# Patient Record
Sex: Male | Born: 1975 | Race: White | Hispanic: No | Marital: Married | Smoking: Former smoker
Health system: Southern US, Community
[De-identification: ages and names within clinical notes are randomized; demographics above are authoritative.]

## PROBLEM LIST (undated history)

## (undated) DIAGNOSIS — Z87442 Personal history of urinary calculi: Secondary | ICD-10-CM

## (undated) DIAGNOSIS — E785 Hyperlipidemia, unspecified: Secondary | ICD-10-CM

## (undated) DIAGNOSIS — K7581 Nonalcoholic steatohepatitis (NASH): Secondary | ICD-10-CM

## (undated) DIAGNOSIS — I10 Essential (primary) hypertension: Secondary | ICD-10-CM

## (undated) HISTORY — DX: Essential (primary) hypertension: I10

## (undated) HISTORY — DX: Personal history of urinary calculi: Z87.442

## (undated) HISTORY — PX: WISDOM TOOTH EXTRACTION: SHX21

## (undated) HISTORY — DX: Nonalcoholic steatohepatitis (NASH): K75.81

## (undated) HISTORY — DX: Hyperlipidemia, unspecified: E78.5

## (undated) HISTORY — PX: APPENDECTOMY: SHX54

---
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
## 2011-06-29 ENCOUNTER — Ambulatory Visit: Payer: Self-pay | Admitting: Family Medicine

## 2012-04-03 ENCOUNTER — Emergency Department: Payer: Self-pay | Admitting: *Deleted

## 2012-04-03 LAB — CBC WITH DIFFERENTIAL/PLATELET
Basophil #: 0 10*3/uL (ref 0.0–0.1)
Basophil %: 0.4 %
Eosinophil #: 0.1 10*3/uL (ref 0.0–0.7)
Eosinophil %: 0.9 %
HCT: 39.9 % — ABNORMAL LOW (ref 40.0–52.0)
HGB: 13.9 g/dL (ref 13.0–18.0)
Lymphocyte #: 1.7 10*3/uL (ref 1.0–3.6)
Lymphocyte %: 16.8 %
MCH: 30.8 pg (ref 26.0–34.0)
MCHC: 34.9 g/dL (ref 32.0–36.0)
MCV: 88 fL (ref 80–100)
Monocyte #: 0.6 x10 3/mm (ref 0.2–1.0)
Monocyte %: 5.9 %
Neutrophil #: 7.8 10*3/uL — ABNORMAL HIGH (ref 1.4–6.5)
Neutrophil %: 76 %
Platelet: 200 10*3/uL (ref 150–440)
RBC: 4.52 10*6/uL (ref 4.40–5.90)
RDW: 13.5 % (ref 11.5–14.5)
WBC: 10.2 10*3/uL (ref 3.8–10.6)

## 2012-04-03 LAB — URINALYSIS, COMPLETE
Bacteria: NONE SEEN
Bilirubin,UR: NEGATIVE
Glucose,UR: NEGATIVE mg/dL (ref 0–75)
Ketone: NEGATIVE
Leukocyte Esterase: NEGATIVE
Nitrite: NEGATIVE
Ph: 5 (ref 4.5–8.0)
Protein: NEGATIVE
RBC,UR: 262 /HPF (ref 0–5)
Specific Gravity: 1.019 (ref 1.003–1.030)
Squamous Epithelial: NONE SEEN
WBC UR: NONE SEEN /HPF (ref 0–5)

## 2012-04-03 LAB — COMPREHENSIVE METABOLIC PANEL
Albumin: 4.6 g/dL (ref 3.4–5.0)
Alkaline Phosphatase: 79 U/L (ref 50–136)
Anion Gap: 9 (ref 7–16)
BUN: 19 mg/dL — ABNORMAL HIGH (ref 7–18)
Bilirubin,Total: 0.7 mg/dL (ref 0.2–1.0)
Calcium, Total: 9.2 mg/dL (ref 8.5–10.1)
Chloride: 102 mmol/L (ref 98–107)
Co2: 28 mmol/L (ref 21–32)
Creatinine: 0.97 mg/dL (ref 0.60–1.30)
EGFR (African American): >60
EGFR (Non-African Amer.): >60
Glucose: 122 mg/dL — ABNORMAL HIGH (ref 65–99)
Osmolality: 281 (ref 275–301)
Potassium: 4.3 mmol/L (ref 3.5–5.1)
SGOT(AST): 76 U/L — ABNORMAL HIGH (ref 15–37)
SGPT (ALT): 210 U/L — ABNORMAL HIGH
Sodium: 139 mmol/L (ref 136–145)
Total Protein: 8.4 g/dL — ABNORMAL HIGH (ref 6.4–8.2)

## 2013-09-11 IMAGING — CT CT STONE STUDY
1 of 2 series · 15 of 32 positions shown, 19 images · non-contrast
Comparison: none

REASON FOR EXAM: left flank pain
COMMENTS:

[Series 2: 3mm soft tissue · axial · 0.87mm/px · z∈[-554,-98]mm · 15 of 166 slices shown, 19 images]
[im 7/166  soft-tissue]
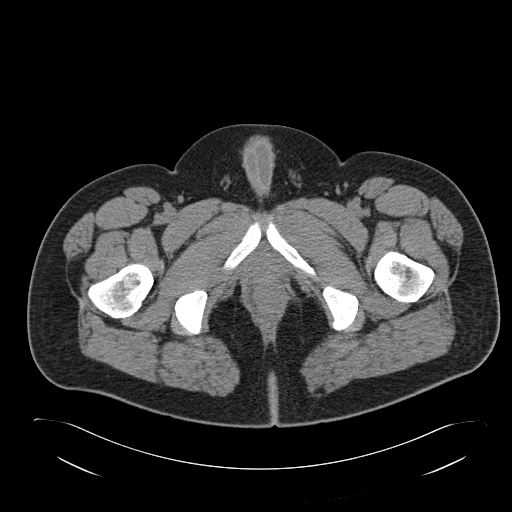
[im 7/166  bone]
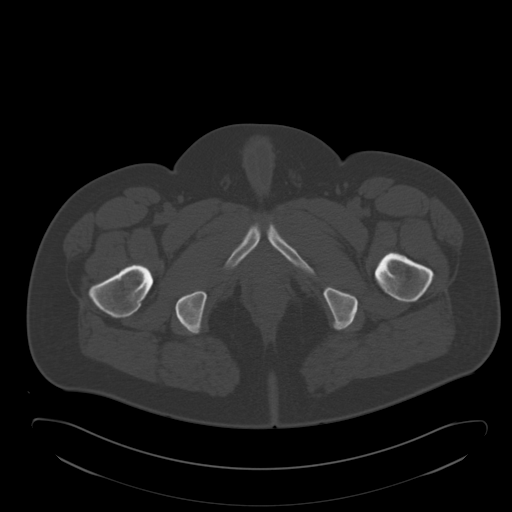
[im 20/166  soft-tissue]
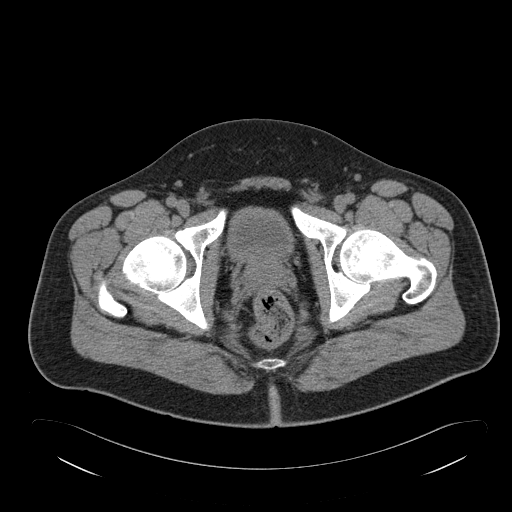
[im 34/166  soft-tissue]
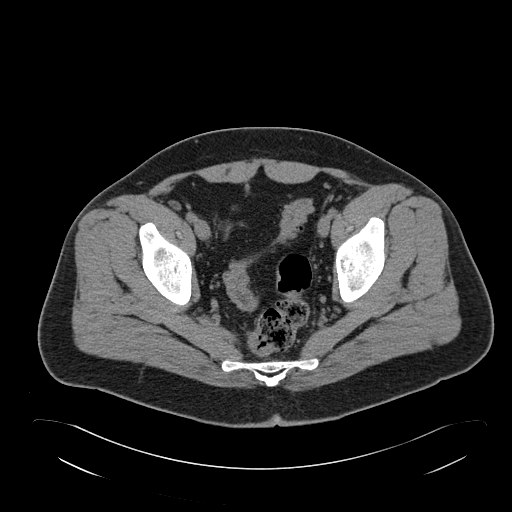
[im 47/166  soft-tissue]
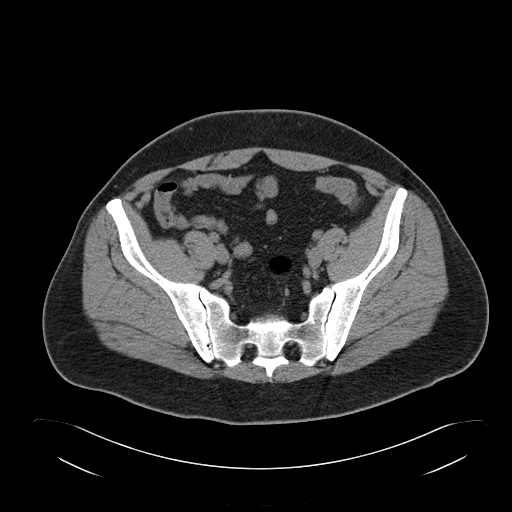
[im 60/166  soft-tissue]
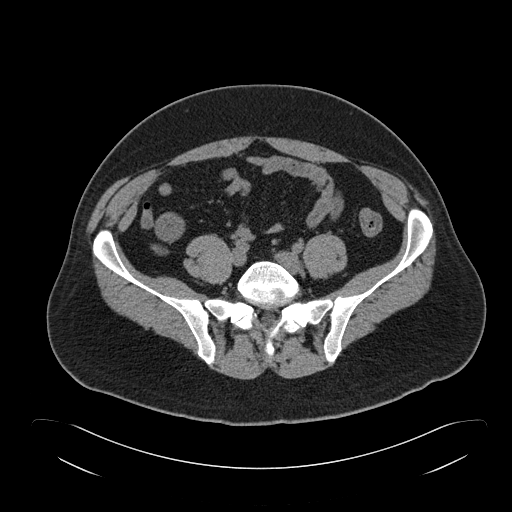
[im 73/166  soft-tissue]
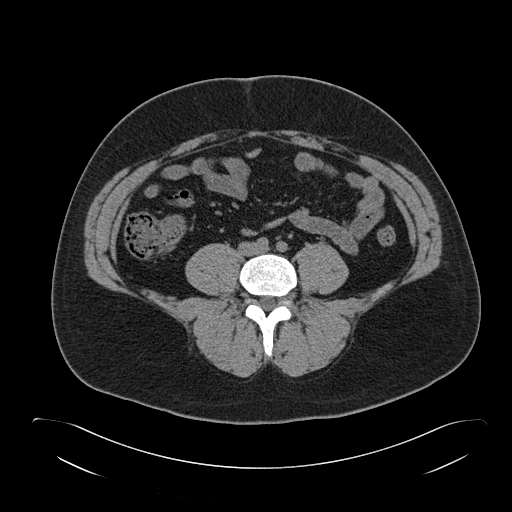
[im 86/166  soft-tissue]
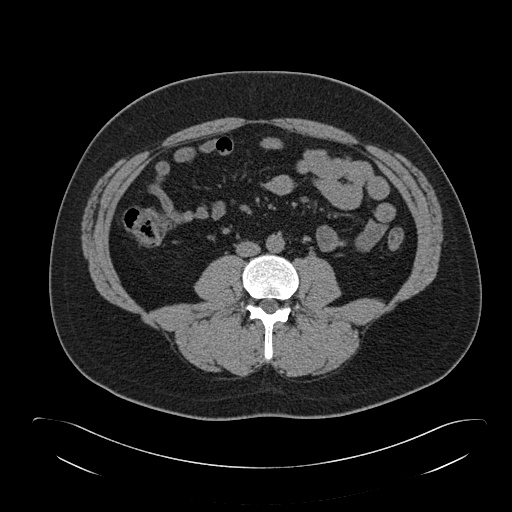
[im 93/166  soft-tissue]
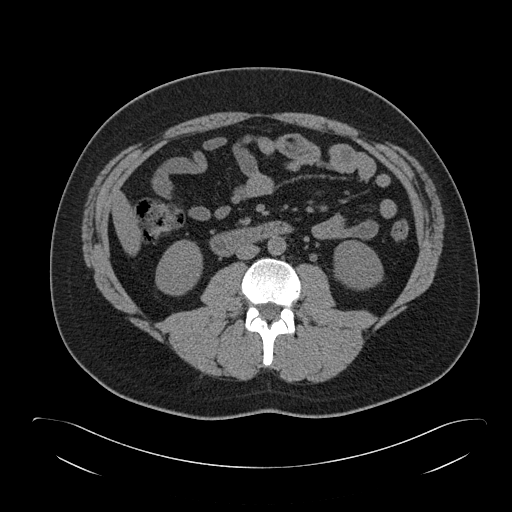
[im 106/166  soft-tissue]
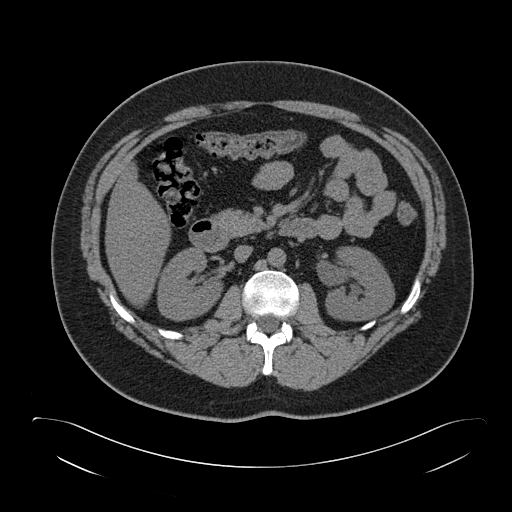
[im 106/166  bone]
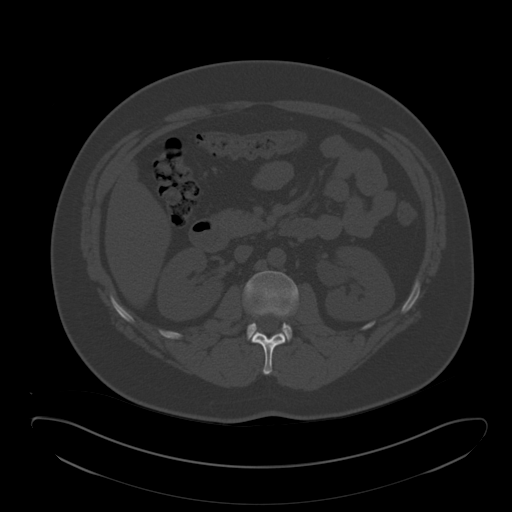
[im 119/166  soft-tissue]
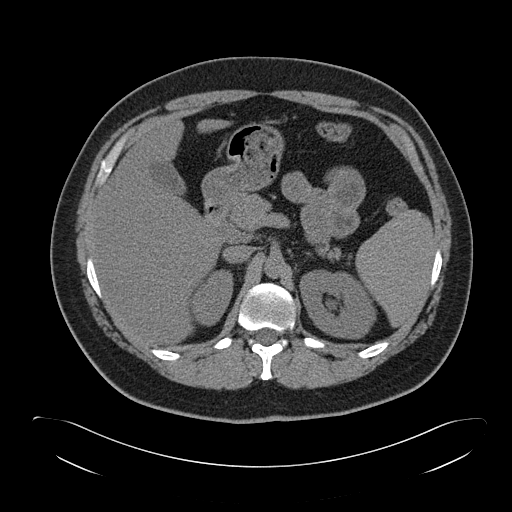
[im 133/166  soft-tissue]
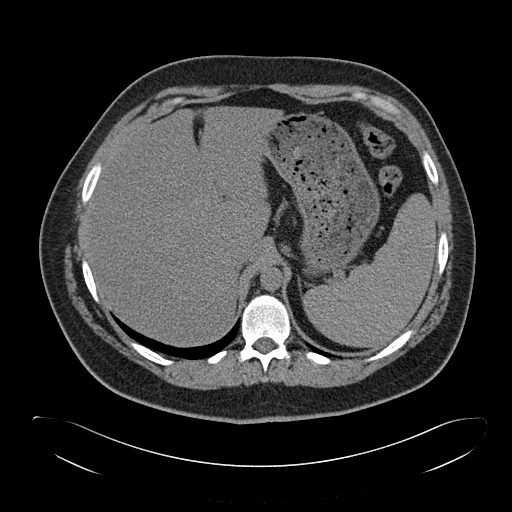
[im 139/166  lung]
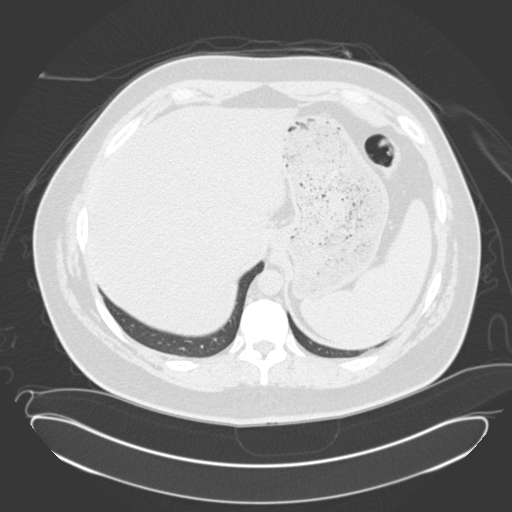
[im 146/166  soft-tissue]
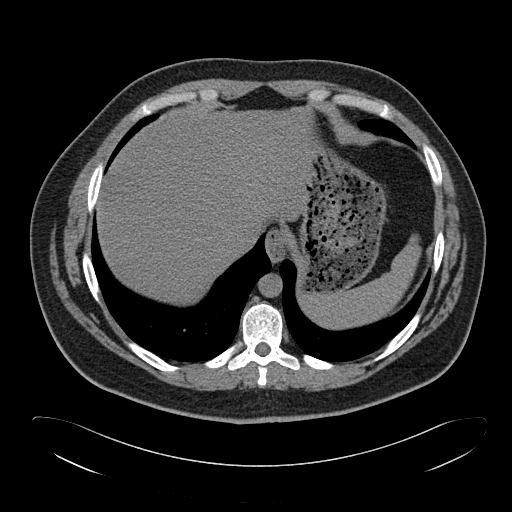
[im 146/166  lung]
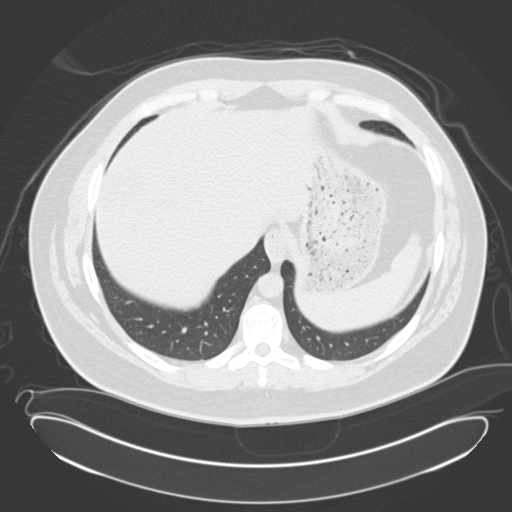
[im 152/166  lung]
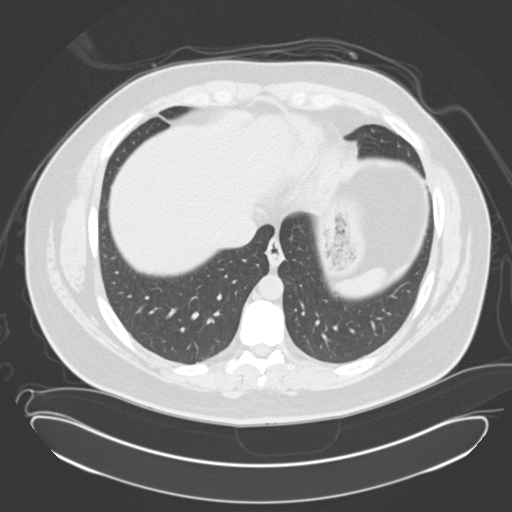
[im 159/166  soft-tissue]
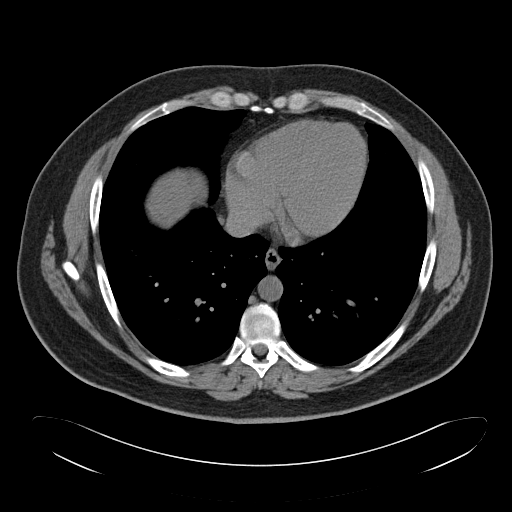
[im 159/166  lung]
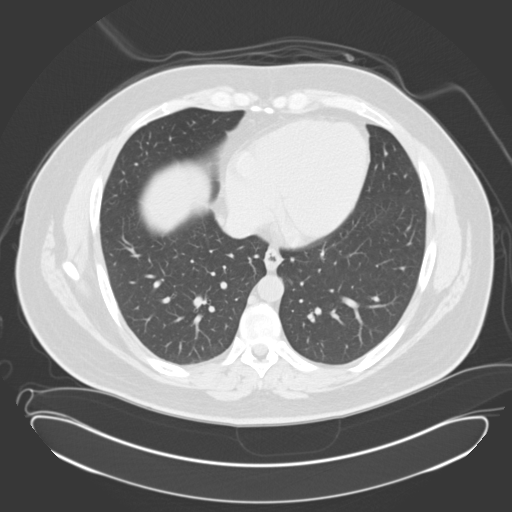

[15 of 32 positions shown; findings below may reference images not displayed]

PROCEDURE:     CT  - CT ABDOMEN /PELVIS WO (STONE)  - April 03, 2012  [DATE]

RESULT:     Axial noncontrast CT scanning was performed through the abdomen
and pelvis with reconstructions at 3 mm intervals and slice thicknesses.
Review of multiplanar reconstructed images was performed separately on the
VIA monitor.

On the left there is minimal hydronephrosis and proximal hydroureter
secondary to a 3 mm diameter mid ureteral stone seen on image 89. No other
stones are seen within the ureter. There is a 2 mm diameter upper pole
nonobstructing stone on the left. On the right no stones are evident.
Parenchymal hypodensities in the right kidney likely reflect cysts. The
urinary bladder is partially distended and grossly normal. The prostate
gland and seminal vesicles are normal in appearance.

The unopacified loops of small and large bowel exhibit no evidence of ileus
nor obstruction. There are scattered diverticula. The liver exhibits no
focal mass nor ductal dilation. The gallbladder is adequately distended with
no evidence of stones. The spleen is normal in density and contour and top
normal in size measuring 14 cm in oblique AP dimension. An accessory spleen
on image 42 measures 2 cm in diameter. The pancreas and adrenal glands
exhibit no acute abnormality. The stomach is moderately distended with
fluid. The caliber of the abdominal aorta is normal.

The lung bases are clear. The lumbar vertebral bodies are preserved in
height.
IMPRESSION: 1. There is minimal hydronephrosis on the left secondary to a 3 mm diameter
mid left ureteral stone. A nonobstructing 2 mm diameter stone lies in the
left upper pole calyx. There are no stones identified in the right kidney or
in the urinary bladder.
2. There is borderline to mild splenomegaly with an accessory spleen.
3. There is no acute abnormality of the liver or gallbladder or pancreas.
4. There is no acute bowel abnormality.

[REDACTED]

## 2016-09-07 HISTORY — PX: VASECTOMY: SHX75

## 2017-04-22 ENCOUNTER — Encounter: Payer: Self-pay | Admitting: Family Medicine

## 2017-04-22 ENCOUNTER — Ambulatory Visit (INDEPENDENT_AMBULATORY_CARE_PROVIDER_SITE_OTHER): Payer: 59 | Admitting: Family Medicine

## 2017-04-22 ENCOUNTER — Ambulatory Visit: Payer: Self-pay | Admitting: Family Medicine

## 2017-04-22 ENCOUNTER — Encounter: Payer: Self-pay | Admitting: *Deleted

## 2017-04-22 VITALS — BP 138/90 | HR 81 | Temp 98.2°F | Ht 73.25 in | Wt 245.5 lb

## 2017-04-22 DIAGNOSIS — E785 Hyperlipidemia, unspecified: Secondary | ICD-10-CM | POA: Diagnosis not present

## 2017-04-22 DIAGNOSIS — K760 Fatty (change of) liver, not elsewhere classified: Secondary | ICD-10-CM | POA: Insufficient documentation

## 2017-04-22 DIAGNOSIS — I10 Essential (primary) hypertension: Secondary | ICD-10-CM | POA: Diagnosis not present

## 2017-04-22 DIAGNOSIS — E669 Obesity, unspecified: Secondary | ICD-10-CM | POA: Diagnosis not present

## 2017-04-22 DIAGNOSIS — K7581 Nonalcoholic steatohepatitis (NASH): Secondary | ICD-10-CM | POA: Insufficient documentation

## 2017-04-22 LAB — COMPREHENSIVE METABOLIC PANEL
ALT: 83 U/L — ABNORMAL HIGH (ref 0–53)
AST: 61 U/L — ABNORMAL HIGH (ref 0–37)
Albumin: 4.8 g/dL (ref 3.5–5.2)
Alkaline Phosphatase: 58 U/L (ref 39–117)
BUN: 13 mg/dL (ref 6–23)
CO2: 28 mEq/L (ref 19–32)
Calcium: 10 mg/dL (ref 8.4–10.5)
Chloride: 102 mEq/L (ref 96–112)
Creatinine, Ser: 0.87 mg/dL (ref 0.40–1.50)
GFR: 103.51 mL/min (ref >60.00)
Glucose, Bld: 100 mg/dL — ABNORMAL HIGH (ref 70–99)
Potassium: 3.8 mEq/L (ref 3.5–5.1)
Sodium: 139 mEq/L (ref 135–145)
Total Bilirubin: 0.8 mg/dL (ref 0.2–1.2)
Total Protein: 7.8 g/dL (ref 6.0–8.3)

## 2017-04-22 LAB — TSH: TSH: 1.23 u[IU]/mL (ref 0.35–4.50)

## 2017-04-22 LAB — IBC PANEL
Iron: 70 ug/dL (ref 42–165)
Saturation Ratios: 14 % — ABNORMAL LOW (ref 20.0–50.0)
Transferrin: 356 mg/dL (ref 212.0–360.0)

## 2017-04-22 LAB — LIPID PANEL
Cholesterol: 191 mg/dL (ref 0–200)
HDL: 36.2 mg/dL — ABNORMAL LOW (ref >39.00)
LDL Cholesterol: 119 mg/dL — ABNORMAL HIGH (ref 0–99)
NonHDL: 155.21
Total CHOL/HDL Ratio: 5
Triglycerides: 183 mg/dL — ABNORMAL HIGH (ref 0.0–149.0)
VLDL: 36.6 mg/dL (ref 0.0–40.0)

## 2017-04-22 MED ORDER — HYDROCHLOROTHIAZIDE 25 MG PO TABS: 25.0000 mg | ORAL_TABLET | Freq: Every day | ORAL | 1 refills | Status: DC

## 2017-04-22 NOTE — Assessment & Plan Note (Signed)
Discussed healthy diet and lifestyle changes to affect sustainable weight loss  

## 2017-04-22 NOTE — Assessment & Plan Note (Addendum)
Carries this dx. Reviewing chart, abdominal MRI from 2013 showed normal liver, borderline splenomegaly with accessory spleen. Update labs today.

## 2017-04-22 NOTE — Assessment & Plan Note (Addendum)
Chronic, stable. Continue current regimen. H/o angioedema to ACEI. Check BMP today.

## 2017-04-22 NOTE — Progress Notes (Signed)
BP 138/90   Pulse 81   Temp 98.2 F (36.8 C) (Oral)   Ht 6' 1.25" (1.861 m)   Wt 245 lb 8 oz (111.4 kg)   SpO2 97%   BMI 32.17 kg/m    CC: new pt to establish care Subjective:    Patient ID: Andrew Phillips, male    DOB: 1975-11-17, 41 y.o.   MRN: [id number]  HPI: Andrew Phillips is a 41 y.o. male presenting on 04/22/2017 for Establish Care   Prior saw [facility].  HTN - dx 10 yrs ago. fmhx of this. Compliant with HCTZ 52m daily. Denies HA, vision changes, CP/tightness, SOB, leg swelling. Lisinopril caused angioedema. He has been tried on several other medications. Checks bp at home - will controlled. Tries for low salt diet. Working on weight loss (from 260lbs).  Dyslipidemia - not on meds.  Also carries dx NASH and metabolic syndrome. 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 Interim medical history since our last visit reviewed. Allergies and medications reviewed and updated. No outpatient prescriptions prior to visit.   No facility-administered medications prior to visit.      Per HPI unless specifically indicated in ROS section below Review of Systems     Objective:    BP 138/90   Pulse 81   Temp 98.2 F (36.8 C) (Oral)   Ht 6' 1.25" (1.861 m)   Wt 245 lb 8 oz (111.4 kg)   SpO2 97%   BMI 32.17 kg/m   Wt Readings from Last 3 Encounters:  04/22/17 245 lb 8 oz (111.4 kg)    Physical Exam  Constitutional: He appears well-developed and well-nourished. No distress.  HENT:  Mouth/Throat: Oropharynx is clear and moist. No oropharyngeal exudate.  Eyes: Pupils are equal, round, and reactive to light. EOM are normal.  Neck: Normal range of motion. Neck  supple. No thyromegaly present.  Cardiovascular: Normal rate, regular rhythm, normal heart sounds and intact distal pulses.   No murmur heard. Pulmonary/Chest: Effort normal and breath sounds normal. No respiratory distress. He has no wheezes. He has no rales.  Abdominal: Soft. Normal appearance and bowel sounds are normal. He exhibits no distension and no mass. There is splenomegaly (mild). There is no hepatomegaly. There is no tenderness. There is no rebound, no guarding and no CVA tenderness.  Musculoskeletal: He exhibits no edema.  Lymphadenopathy:    He has no cervical adenopathy.  Skin: Skin is warm and dry. No rash noted.  Psychiatric: He has a normal mood and affect.  Nursing note and vitals reviewed.  Results for orders placed or performed in visit on 04/03/12  CBC with Differential/Platelet  Result Value Ref Range   WBC 10.2 3.8 - 10.6 x10 3/mm 3   RBC 4.52 4.40 - 5.90 x10 6/mm 3   HGB 13.9 13.0 - 18.0 g/dL   HCT 39.9 (L) 40.0 - 52.0 %   MCV 88 80 - 100 fL   MCH 30.8 26.0 - 34.0 pg   MCHC 34.9 32.0 - 36.0 g/dL   RDW 13.5 11.5 - 14.5 %   Platelet 200 150 - 440 x10 3/mm 3   Neutrophil % 76.0 %   Lymphocyte %  16.8 %   Monocyte % 5.9 %   Eosinophil % 0.9 %   Basophil % 0.4 %   Neutrophil # 7.8 (H) 1.4 - 6.5 x10 3/mm 3   Lymphocyte # 1.7 1.0 - 3.6 x10 3/mm 3   Monocyte # 0.6 0.2 - 1.0 x10 3/mm    Eosinophil # 0.1 0.0 - 0.7 x10 3/mm 3   Basophil # 0.0 0.0 - 0.1 x10 3/mm 3  Comprehensive metabolic panel  Result Value Ref Range   Glucose 122 (H) 65 - 99 mg/dL   BUN 19 (H) 7 - 18 mg/dL   Creatinine 0.97 0.60 - 1.30 mg/dL   Sodium 139 136 - 145 mmol/L   Potassium 4.3 3.5 - 5.1 mmol/L   Chloride 102 98 - 107 mmol/L   Co2 28 21 - 32 mmol/L   Calcium, Total 9.2 8.5 - 10.1 mg/dL   SGOT(AST) 76 (H) 15 - 37 Unit/L   SGPT (ALT) 210 (H) U/L   Alkaline Phosphatase 79 50 - 136 Unit/L   Albumin 4.6 3.4 - 5.0 g/dL   Total Protein 8.4 (H) 6.4 - 8.2 g/dL   Bilirubin,Total 0.7 0.2 -  1.0 mg/dL   Osmolality 281 275 - 301   Anion Gap 9 7 - 16   EGFR (African American) >60    EGFR (Non-African Amer.) >60   Urinalysis, Complete  Result Value Ref Range   Color - urine Yellow    Clarity - urine Hazy    Glucose,UR Negative 0 - 75 mg/dL   Bilirubin,UR Negative NEGATIVE   Ketone Negative NEGATIVE   Specific Gravity 1.019 1.003 - 1.030   Blood 3+ NEGATIVE   Ph 5.0 4.5 - 8.0   Protein Negative NEGATIVE   Nitrite Negative NEGATIVE   Leukocyte Esterase Negative NEGATIVE   RBC,UR 262 /HPF 0 - 5 /HPF   WBC UR NONE SEEN 0 - 5 /HPF   Bacteria NONE SEEN NONE SEEN   Squamous Epithelial NONE SEEN    Mucous PRESENT       Assessment & Plan:   Problem List Items Addressed This Visit    HLD (hyperlipidemia)    Update FLP today. Pt denies significant issues with this.       Relevant Medications   hydrochlorothiazide (HYDRODIURIL) 25 MG tablet   Other Relevant Orders   Lipid panel   Comprehensive metabolic panel   TSH   Hypertension - Primary    Chronic, stable. Continue current regimen. H/o angioedema to ACEI. Check BMP today.       Relevant Medications   hydrochlorothiazide (HYDRODIURIL) 25 MG tablet   Other Relevant Orders   Comprehensive metabolic panel   NASH (nonalcoholic steatohepatitis)    Carries this dx. Reviewing chart, abdominal MRI from 2013 showed normal liver, borderline splenomegaly with accessory spleen. Update labs today.       Relevant Orders   Comprehensive metabolic panel   TSH   IBC panel   Obesity, Class I, BMI 30-34.9    Discussed healthy diet and lifestyle changes to affect sustainable weight loss.           Follow up plan: Return in about 6 months (around 10/23/2017) for annual exam, prior fasting for blood work.  Ria Bush, MD

## 2017-04-22 NOTE — Assessment & Plan Note (Signed)
Update FLP today. Pt denies significant issues with this.

## 2017-04-22 NOTE — Patient Instructions (Signed)
Labs today HCTZ refilled today.  Continue current medicine Work on low salt diet - look at [facility] Good to meet you today, call us with questions. Return as needed or in 6 months for physical.   DASH Eating Plan DASH stands for "Dietary Approaches to Stop Hypertension." The DASH eating plan is a healthy eating plan that has been shown to reduce high blood pressure (hypertension). It may also reduce your risk for type 2 diabetes, heart disease, and stroke. The DASH eating plan may also help with weight loss. What are tips for following this plan? General guidelines  Avoid eating more than 2,300 mg (milligrams) of salt (sodium) a day. If you have hypertension, you may need to reduce your sodium intake to 1,500 mg a day.  Limit alcohol intake to no more than 1 drink a day for nonpregnant women and 2 drinks a day for men. One drink equals 12 oz of beer, 5 oz of wine, or 1 oz of hard liquor.  Work with your health care provider to maintain a healthy body weight or to lose weight. Ask what an ideal weight is for you.  Get at least 30 minutes of exercise that causes your heart to beat faster (aerobic exercise) most days of the week. Activities may include walking, swimming, or biking.  Work with your health care provider or diet and nutrition specialist (dietitian) to adjust your eating plan to your individual calorie needs. Reading food labels  Check food labels for the amount of sodium per serving. Choose foods with less than 5 percent of the Daily Value of sodium. Generally, foods with less than 300 mg of sodium per serving fit into this eating plan.  To find whole grains, look for the word "whole" as the first word in the ingredient list. Shopping  Buy products labeled as "low-sodium" or "no salt added."  Buy fresh foods. Avoid canned foods and premade or frozen meals. Cooking  Avoid adding salt when cooking. Use salt-free seasonings or herbs instead of table salt or sea salt. Check with  your health care provider or pharmacist before using salt substitutes.  Do not fry foods. Cook foods using healthy methods such as baking, boiling, grilling, and broiling instead.  Cook with heart-healthy oils, such as olive, canola, soybean, or sunflower oil. Meal planning   Eat a balanced diet that includes: ? 5 or more servings of fruits and vegetables each day. At each meal, try to fill half of your plate with fruits and vegetables. ? Up to 6-8 servings of whole grains each day. ? Less than 6 oz of lean meat, poultry, or fish each day. A 3-oz serving of meat is about the same size as a deck of cards. One egg equals 1 oz. ? 2 servings of low-fat dairy each day. ? A serving of nuts, seeds, or beans 5 times each week. ? Heart-healthy fats. Healthy fats called Omega-3 fatty acids are found in foods such as flaxseeds and coldwater fish, like sardines, salmon, and mackerel.  Limit how much you eat of the following: ? Canned or prepackaged foods. ? Food that is high in trans fat, such as fried foods. ? Food that is high in saturated fat, such as fatty meat. ? Sweets, desserts, sugary drinks, and other foods with added sugar. ? Full-fat dairy products.  Do not salt foods before eating.  Try to eat at least 2 vegetarian meals each week.  Eat more home-cooked food and less restaurant, buffet, and fast food.  When eating at a restaurant, ask that your food be prepared with less salt or no salt, if possible. What foods are recommended? The items listed may not be a complete list. Talk with your dietitian about what dietary choices are best for you. Grains Whole-grain or whole-wheat bread. Whole-grain or whole-wheat pasta. Brown rice. [place]. Bulgur. Whole-grain and low-sodium cereals. Pita bread. Low-fat, low-sodium crackers. Whole-wheat flour tortillas. Vegetables Fresh or frozen vegetables (raw, steamed, roasted, or grilled). Low-sodium or reduced-sodium tomato and vegetable  juice. Low-sodium or reduced-sodium tomato sauce and tomato paste. Low-sodium or reduced-sodium canned vegetables. Fruits All fresh, dried, or frozen fruit. Canned fruit in natural juice (without added sugar). Meat and other protein foods Skinless chicken or [place]. Ground chicken or [place]. Pork with fat trimmed off. Fish and seafood. Egg whites. Dried beans, peas, or lentils. Unsalted nuts, nut butters, and seeds. Unsalted canned beans. Lean cuts of beef with fat trimmed off. Low-sodium, lean deli meat. Dairy Low-fat (1%) or fat-free (skim) milk. Fat-free, low-fat, or reduced-fat cheeses. Nonfat, low-sodium ricotta or cottage cheese. Low-fat or nonfat yogurt. Low-fat, low-sodium cheese. Fats and oils Soft margarine without trans fats. Vegetable oil. Low-fat, reduced-fat, or light mayonnaise and salad dressings (reduced-sodium). Canola, safflower, olive, soybean, and sunflower oils. Avocado. Seasoning and other foods Herbs. Spices. Seasoning mixes without salt. Unsalted popcorn and pretzels. Fat-free sweets. What foods are not recommended? The items listed may not be a complete list. Talk with your dietitian about what dietary choices are best for you. Grains Baked goods made with fat, such as croissants, muffins, or some breads. Dry pasta or rice meal packs. Vegetables Creamed or fried vegetables. Vegetables in a cheese sauce. Regular canned vegetables (not low-sodium or reduced-sodium). Regular canned tomato sauce and paste (not low-sodium or reduced-sodium). Regular tomato and vegetable juice (not low-sodium or reduced-sodium). Angie Fava. Olives. Fruits Canned fruit in a light or heavy syrup. Fried fruit. Fruit in cream or butter sauce. Meat and other protein foods Fatty cuts of meat. Ribs. Fried meat. Berniece Salines. Sausage. Bologna and other processed lunch meats. Salami. Fatback. Hotdogs. Bratwurst. Salted nuts and seeds. Canned beans with added salt. Canned or smoked fish. Whole eggs or egg yolks.  Chicken or [place] with skin. Dairy Whole or 2% milk, cream, and half-and-half. Whole or full-fat cream cheese. Whole-fat or sweetened yogurt. Full-fat cheese. Nondairy creamers. Whipped toppings. Processed cheese and cheese spreads. Fats and oils Butter. Stick margarine. Lard. Shortening. Ghee. Bacon fat. Tropical oils, such as coconut, palm kernel, or palm oil. Seasoning and other foods Salted popcorn and pretzels. Onion salt, garlic salt, seasoned salt, table salt, and sea salt. Worcestershire sauce. Tartar sauce. Barbecue sauce. Teriyaki sauce. Soy sauce, including reduced-sodium. Steak sauce. Canned and packaged gravies. Fish sauce. Oyster sauce. Cocktail sauce. Horseradish that you find on the shelf. Ketchup. Mustard. Meat flavorings and tenderizers. Bouillon cubes. Hot sauce and Tabasco sauce. Premade or packaged marinades. Premade or packaged taco seasonings. Relishes. Regular salad dressings. Where to find more information:  National Heart, Lung, and [place]: https://wilson-eaton.com/  American Heart Association: www.heart.org Summary  The DASH eating plan is a healthy eating plan that has been shown to reduce high blood pressure (hypertension). It may also reduce your risk for type 2 diabetes, heart disease, and stroke.  With the DASH eating plan, you should limit salt (sodium) intake to 2,300 mg a day. If you have hypertension, you may need to reduce your sodium intake to 1,500 mg a day.  When on the DASH eating plan,  aim to eat more fresh fruits and vegetables, whole grains, lean proteins, low-fat dairy, and heart-healthy fats.  Work with your health care provider or diet and nutrition specialist (dietitian) to adjust your eating plan to your individual calorie needs. This information is not intended to replace advice given to you by your health care provider. Make sure you discuss any questions you have with your health care provider. Document Released: 08/13/2011 Document Revised:  08/17/2016 Document Reviewed: 08/17/2016 Elsevier Interactive Patient Education  2017 [employer].

## 2017-04-27 ENCOUNTER — Encounter: Payer: Self-pay | Admitting: *Deleted

## 2017-09-02 ENCOUNTER — Ambulatory Visit (INDEPENDENT_AMBULATORY_CARE_PROVIDER_SITE_OTHER): Payer: 59 | Admitting: Family Medicine

## 2017-09-02 ENCOUNTER — Encounter: Payer: Self-pay | Admitting: Radiology

## 2017-09-02 ENCOUNTER — Ambulatory Visit (INDEPENDENT_AMBULATORY_CARE_PROVIDER_SITE_OTHER)
Admission: RE | Admit: 2017-09-02 | Discharge: 2017-09-02 | Disposition: A | Discharge status: Home / Self Care | Payer: 59 | Source: Ambulatory Visit | Attending: Family Medicine | Admitting: Family Medicine

## 2017-09-02 ENCOUNTER — Encounter: Payer: Self-pay | Admitting: Family Medicine

## 2017-09-02 VITALS — BP 118/70 | HR 103 | Temp 98.2°F | Wt 251.0 lb

## 2017-09-02 DIAGNOSIS — R5383 Other fatigue: Secondary | ICD-10-CM

## 2017-09-02 DIAGNOSIS — R509 Fever, unspecified: Secondary | ICD-10-CM

## 2017-09-02 DIAGNOSIS — Z8619 Personal history of other infectious and parasitic diseases: Secondary | ICD-10-CM | POA: Insufficient documentation

## 2017-09-02 LAB — POC URINALSYSI DIPSTICK (AUTOMATED)
Bilirubin, UA: NEGATIVE
Blood, UA: NEGATIVE
Glucose, UA: NEGATIVE
Ketones, UA: NEGATIVE
Leukocytes, UA: NEGATIVE
Nitrite, UA: NEGATIVE
Protein, UA: NEGATIVE
Spec Grav, UA: 1.02 (ref 1.010–1.025)
Urobilinogen, UA: 0.2 E.U./dL
pH, UA: 6 (ref 5.0–8.0)

## 2017-09-02 LAB — COMPREHENSIVE METABOLIC PANEL
ALT: 78 U/L — ABNORMAL HIGH (ref 0–53)
AST: 49 U/L — ABNORMAL HIGH (ref 0–37)
Albumin: 4.6 g/dL (ref 3.5–5.2)
Alkaline Phosphatase: 65 U/L (ref 39–117)
BUN: 7 mg/dL (ref 6–23)
CO2: 31 mEq/L (ref 19–32)
Calcium: 9.1 mg/dL (ref 8.4–10.5)
Chloride: 96 mEq/L (ref 96–112)
Creatinine, Ser: 0.93 mg/dL (ref 0.40–1.50)
GFR: 95.66 mL/min (ref >60.00)
Glucose, Bld: 97 mg/dL (ref 70–99)
Potassium: 3.6 mEq/L (ref 3.5–5.1)
Sodium: 133 mEq/L — ABNORMAL LOW (ref 135–145)
Total Bilirubin: 1.9 mg/dL — ABNORMAL HIGH (ref 0.2–1.2)
Total Protein: 7.8 g/dL (ref 6.0–8.3)

## 2017-09-02 LAB — CBC WITH DIFFERENTIAL/PLATELET
Basophils Absolute: 0 10*3/uL (ref 0.0–0.1)
Basophils Relative: 0.7 % (ref 0.0–3.0)
Eosinophils Absolute: 0.1 10*3/uL (ref 0.0–0.7)
Eosinophils Relative: 1.9 % (ref 0.0–5.0)
HCT: 35.6 % — ABNORMAL LOW (ref 39.0–52.0)
Hemoglobin: 11.9 g/dL — ABNORMAL LOW (ref 13.0–17.0)
Lymphocytes Relative: 31.9 % (ref 12.0–46.0)
Lymphs Abs: 1.4 10*3/uL (ref 0.7–4.0)
MCHC: 33.4 g/dL (ref 30.0–36.0)
MCV: 86.5 fl (ref 78.0–100.0)
Monocytes Absolute: 0.5 10*3/uL (ref 0.1–1.0)
Monocytes Relative: 10.9 % (ref 3.0–12.0)
Neutro Abs: 2.4 10*3/uL (ref 1.4–7.7)
Neutrophils Relative %: 54.6 % (ref 43.0–77.0)
Platelets: 162 10*3/uL (ref 150.0–400.0)
RBC: 4.11 Mil/uL — ABNORMAL LOW (ref 4.22–5.81)
RDW: 14.5 % (ref 11.5–15.5)
WBC: 4.4 10*3/uL (ref 4.0–10.5)

## 2017-09-02 LAB — MONONUCLEOSIS SCREEN: Mono Screen: NEGATIVE

## 2017-09-02 LAB — SEDIMENTATION RATE: Sed Rate: 55 mm/hr — ABNORMAL HIGH (ref 0–15)

## 2017-09-02 NOTE — Addendum Note (Signed)
Addended by: Brenton Grills on: 76/28/3151 11:41 AM   Modules accepted: Orders

## 2017-09-02 NOTE — Progress Notes (Signed)
BP 118/70 (BP Location: Left Arm, Patient Position: Sitting, Cuff Size: Large)   Pulse (!) 103   Temp 98.2 F (36.8 C) (Oral)   Wt 251 lb (113.9 kg)   SpO2 97%   BMI 32.89 kg/m    CC: fatigue, fever Subjective:    Patient ID: Andrew Phillips, male    DOB: [birth year], 41 y.o.   MRN: [id number]  HPI: Andrew Phillips is a 41 y.o. male presenting on 09/02/2017 for Fatigue (Started 08/16/17. ) and Fever (Started 08/16/17. Usually comes on in the evenings. Taking Tylenol. Felt better by 08/31/17 but came back last night.)   Almost 2 wk h/o off and on fever associated with fatigue - started 08/23/2017 - treated with theraflu and tylenol with temporary improvement. Fever comes on usually in the evenings. Tmax 101. Some decreased appetite. Fatigue is some better, fever persists, yesterday started sooner than previously.   No cough, congestion, ST, ear pain, nausea/vomiting, diarrhea/constipation, new rashes, urinary symptoms. No headaches, neck pain or stiffness.  No weight loss. No night sweats.  No hardware.  No sick contacts at home.  3 sons at home - none of them sick. 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  No recent travel outside of country.  No new medications or food or supplements.   Has not missed work until today.   Relevant past medical, surgical, family and social history reviewed and updated as indicated. Interim medical history since our last visit reviewed. Allergies and medications reviewed and updated. Outpatient Medications Prior to Visit  Medication Sig Dispense Refill  . hydrochlorothiazide (HYDRODIURIL) 25 MG tablet Take 1 tablet (25 mg total) by mouth daily. 90 tablet 1   No facility-administered medications prior to visit.      Per HPI unless specifically indicated in ROS section below Review of Systems     Objective:    BP 118/70 (BP Location: Left Arm, Patient Position: Sitting, Cuff Size: Large)   Pulse (!) 103   Temp 98.2 F (36.8 C) (Oral)   Wt 251 lb (113.9 kg)   SpO2 97%    BMI 32.89 kg/m   Wt Readings from Last 3 Encounters:  09/02/17 251 lb (113.9 kg)  04/22/17 245 lb 8 oz (111.4 kg)    Physical Exam  Constitutional: He appears well-developed and well-nourished. No distress.  HENT:  Head: Normocephalic and atraumatic.  Right Ear: Hearing, tympanic membrane, external ear and ear canal normal.  Left Ear: Hearing, tympanic membrane, external ear and ear canal normal.  Nose: Nose normal. No mucosal edema or rhinorrhea. Right sinus exhibits no maxillary sinus tenderness and no frontal sinus tenderness. Left sinus exhibits no maxillary sinus tenderness and no frontal sinus tenderness.  Mouth/Throat: Uvula is midline, oropharynx is clear and moist and mucous membranes are normal. No oropharyngeal exudate, posterior oropharyngeal edema, posterior oropharyngeal erythema or tonsillar abscesses.  Eyes: Conjunctivae and EOM are normal. Pupils are equal, round, and reactive to light. No scleral icterus.  Neck: Normal range of motion. Neck supple.  Cardiovascular: Normal rate, regular rhythm, normal heart sounds and intact distal pulses.  No murmur heard. Pulmonary/Chest: Effort normal and breath sounds normal. No respiratory distress. He has no wheezes. He has no rales.  Abdominal: Soft. Normal appearance and bowel sounds are normal. He exhibits no distension and no mass. There is no tenderness. There is no rigidity, no rebound, no guarding and negative Murphy's sign.  Musculoskeletal: He exhibits no edema.  Lymphadenopathy:    He has no cervical adenopathy.  Skin: Skin is warm and dry. No rash noted.  Psychiatric: He has a normal mood and affect.  Nursing note and vitals reviewed.  Results for orders placed or performed in visit on 04/22/17  Lipid panel  Result Value Ref Range   Cholesterol 191 0 - 200 mg/dL   Triglycerides 183.0 (H) 0.0 - 149.0 mg/dL   HDL 36.20 (L) >39.00 mg/dL   VLDL 36.6 0.0 - 40.0 mg/dL   LDL Cholesterol 119 (H) 0 - 99 mg/dL   Total  CHOL/HDL Ratio 5    NonHDL 155.21   Comprehensive metabolic panel  Result Value Ref Range   Sodium 139 135 - 145 mEq/L   Potassium 3.8 3.5 - 5.1 mEq/L   Chloride 102 96 - 112 mEq/L   CO2 28 19 - 32 mEq/L   Glucose, Bld 100 (H) 70 - 99 mg/dL   BUN 13 6 - 23 mg/dL   Creatinine, Ser 0.87 0.40 - 1.50 mg/dL   Total Bilirubin 0.8 0.2 - 1.2 mg/dL   Alkaline Phosphatase 58 39 - 117 U/L   AST 61 (H) 0 - 37 U/L   ALT 83 (H) 0 - 53 U/L   Total Protein 7.8 6.0 - 8.3 g/dL   Albumin 4.8 3.5 - 5.2 g/dL   Calcium 10.0 8.4 - 10.5 mg/dL   GFR 103.51 >60.00 mL/min  TSH  Result Value Ref Range   TSH 1.23 0.35 - 4.50 uIU/mL  IBC panel  Result Value Ref Range   Iron 70 42 - 165 ug/dL   Transferrin 356.0 212.0 - 360.0 mg/dL   Saturation Ratios 14.0 (L) 20.0 - 50.0 %      Assessment & Plan:   Problem List Items Addressed This Visit    Fever - Primary    Evening fever of unknown etiology at this time ongoing for last 10 days. He does have child in daycare but children have not been sick recently. Anticipate viral illness, reviewed supportive care over next few days. Overall benign exam today, nontoxic and ok for outpatient eval.  Will check labs, UA, CXR today.  Update if not improving or if persistent fever for further evaluation.       Relevant Orders   Comprehensive metabolic panel   CBC with Differential/Platelet   Sedimentation rate   Mononucleosis screen   DG Chest 2 View    Other Visit Diagnoses    Fatigue, unspecified type       Relevant Orders   Comprehensive metabolic panel   CBC with Differential/Platelet   Sedimentation rate   Mononucleosis screen   DG Chest 2 View       Follow up plan: Return if symptoms worsen or fail to improve.  Ria Bush, MD

## 2017-09-02 NOTE — Assessment & Plan Note (Addendum)
Evening fever of unknown etiology at this time ongoing for last 10 days. He does have child in daycare but children have not been sick recently. Anticipate viral illness, reviewed supportive care over next few days. Overall benign exam today, nontoxic and ok for outpatient eval.  Will check labs, UA, CXR today.  Update if not improving or if persistent fever for further evaluation.

## 2017-09-02 NOTE — Patient Instructions (Addendum)
Labs today Xray and urine check today.  Continue tylenol, pushing fluids and rest.   Fever, Adult A fever is an increase in the body's temperature. It is usually defined as a temperature of 100F (38C) or higher. Brief mild or moderate fevers generally have no long-term effects, and they often do not require treatment. Moderate or high fevers may make you feel uncomfortable and can sometimes be a sign of a serious illness or disease. The sweating that may occur with repeated or prolonged fever may also cause dehydration. Fever is confirmed by taking a temperature with a thermometer. A measured temperature can vary with:  Age.  Time of day.  Location of the thermometer: ? Mouth (oral). ? Rectum (rectal). ? Ear (tympanic). ? Underarm (axillary). ? Forehead (temporal).  Follow these instructions at home: Pay attention to any changes in your symptoms. Take these actions to help with your condition:  Take over-the counter and prescription medicines only as told by your health care provider. Follow the dosing instructions carefully.  If you were prescribed an antibiotic medicine, take it as told by your health care provider. Do not stop taking the antibiotic even if you start to feel better.  Rest as needed.  Drink enough fluid to keep your urine clear or pale yellow. This helps to prevent dehydration.  Sponge yourself or bathe with room-temperature water to help reduce your body temperature as needed. Do not use ice water.  Do not overbundle yourself in blankets or heavy clothes.  Contact a health care provider if:  You vomit.  You cannot eat or drink without vomiting.  You have diarrhea.  You have pain when you urinate.  Your symptoms do not improve with treatment.  You develop new symptoms.  You develop excessive weakness. Get help right away if:  You have shortness of breath or have trouble breathing.  You are dizzy or you faint.  You are disoriented or  confused.  You develop signs of dehydration, such as a dry mouth, decreased urination, or paleness.  You develop severe pain in your abdomen.  You have persistent vomiting or diarrhea.  You develop a skin rash.  Your symptoms suddenly get worse. This information is not intended to replace advice given to you by your health care provider. Make sure you discuss any questions you have with your health care provider. Document Released: 02/17/2001 Document Revised: 01/30/2016 Document Reviewed: 10/18/2014 Elsevier Interactive Patient Education  [employer].

## 2017-09-04 ENCOUNTER — Other Ambulatory Visit: Payer: Self-pay | Admitting: Family Medicine

## 2017-09-04 ENCOUNTER — Encounter: Payer: Self-pay | Admitting: Family Medicine

## 2017-09-04 DIAGNOSIS — R509 Fever, unspecified: Secondary | ICD-10-CM

## 2017-09-04 DIAGNOSIS — K7581 Nonalcoholic steatohepatitis (NASH): Secondary | ICD-10-CM

## 2017-09-04 MED ORDER — DOXYCYCLINE HYCLATE 100 MG PO TABS: 100.0000 mg | ORAL_TABLET | Freq: Two times a day (BID) | ORAL | 0 refills | Status: DC

## 2017-09-06 ENCOUNTER — Other Ambulatory Visit (INDEPENDENT_AMBULATORY_CARE_PROVIDER_SITE_OTHER): Payer: 59

## 2017-09-06 DIAGNOSIS — K7581 Nonalcoholic steatohepatitis (NASH): Secondary | ICD-10-CM

## 2017-09-06 DIAGNOSIS — R509 Fever, unspecified: Secondary | ICD-10-CM | POA: Diagnosis not present

## 2017-09-06 LAB — CBC
HCT: 34.7 % — ABNORMAL LOW (ref 39.0–52.0)
Hemoglobin: 11.6 g/dL — ABNORMAL LOW (ref 13.0–17.0)
MCHC: 33.5 g/dL (ref 30.0–36.0)
MCV: 85.6 fl (ref 78.0–100.0)
Platelets: 180 10*3/uL (ref 150.0–400.0)
RBC: 4.05 Mil/uL — ABNORMAL LOW (ref 4.22–5.81)
RDW: 15 % (ref 11.5–15.5)
WBC: 5.9 10*3/uL (ref 4.0–10.5)

## 2017-09-06 LAB — TSH: TSH: 2.39 u[IU]/mL (ref 0.35–4.50)

## 2017-09-08 ENCOUNTER — Encounter: Payer: Self-pay | Admitting: Family Medicine

## 2017-09-10 ENCOUNTER — Ambulatory Visit
Admission: RE | Admit: 2017-09-10 | Discharge: 2017-09-10 | Disposition: A | Discharge status: Home / Self Care | Payer: No Typology Code available for payment source | Source: Ambulatory Visit | Attending: Family Medicine | Admitting: Family Medicine

## 2017-09-10 DIAGNOSIS — R509 Fever, unspecified: Secondary | ICD-10-CM

## 2017-09-10 DIAGNOSIS — K7581 Nonalcoholic steatohepatitis (NASH): Secondary | ICD-10-CM

## 2017-09-10 LAB — HEPATITIS PANEL, ACUTE
Hep A IgM: NONREACTIVE
Hep B C IgM: NONREACTIVE
Hepatitis B Surface Ag: NONREACTIVE
Hepatitis C Ab: NONREACTIVE
SIGNAL TO CUT-OFF: 0.02 (ref <1.00)

## 2017-09-10 LAB — B. BURGDORFI ANTIBODIES BY WB
B burgdorferi IgG Abs (IB): NEGATIVE
B burgdorferi IgM Abs (IB): POSITIVE — AB
Lyme Disease 18 kD IgG: NONREACTIVE
Lyme Disease 23 kD IgG: NONREACTIVE
Lyme Disease 23 kD IgM: REACTIVE — AB
Lyme Disease 28 kD IgG: NONREACTIVE
Lyme Disease 30 kD IgG: NONREACTIVE
Lyme Disease 39 kD IgG: NONREACTIVE
Lyme Disease 39 kD IgM: REACTIVE — AB
Lyme Disease 41 kD IgG: NONREACTIVE
Lyme Disease 41 kD IgM: REACTIVE — AB
Lyme Disease 45 kD IgG: NONREACTIVE
Lyme Disease 58 kD IgG: NONREACTIVE
Lyme Disease 66 kD IgG: NONREACTIVE
Lyme Disease 93 kD IgG: NONREACTIVE

## 2017-09-10 LAB — ANA: Anti Nuclear Antibody(ANA): NEGATIVE

## 2017-09-10 LAB — LACTATE DEHYDROGENASE: LDH: 417 U/L — ABNORMAL HIGH (ref 100–220)

## 2017-09-10 LAB — ROCKY MTN SPOTTED FVR ABS PNL(IGG+IGM)
RMSF IgG: NOT DETECTED
RMSF IgM: NOT DETECTED

## 2017-09-10 LAB — RHEUMATOID FACTOR: Rhuematoid fact SerPl-aCnc: 32 IU/mL — ABNORMAL HIGH (ref <14)

## 2017-09-11 ENCOUNTER — Encounter: Payer: Self-pay | Admitting: Family Medicine

## 2017-10-28 ENCOUNTER — Other Ambulatory Visit: Payer: Self-pay | Admitting: Family Medicine

## 2018-01-28 ENCOUNTER — Other Ambulatory Visit: Payer: Self-pay | Admitting: Family Medicine

## 2018-02-23 ENCOUNTER — Encounter: Payer: No Typology Code available for payment source | Admitting: Family Medicine

## 2018-03-01 ENCOUNTER — Ambulatory Visit (INDEPENDENT_AMBULATORY_CARE_PROVIDER_SITE_OTHER): Payer: No Typology Code available for payment source | Admitting: Family Medicine

## 2018-03-01 ENCOUNTER — Encounter: Payer: Self-pay | Admitting: Family Medicine

## 2018-03-01 VITALS — BP 132/82 | HR 65 | Temp 97.8°F | Ht 73.0 in | Wt 228.0 lb

## 2018-03-01 DIAGNOSIS — Z8619 Personal history of other infectious and parasitic diseases: Secondary | ICD-10-CM

## 2018-03-01 DIAGNOSIS — E669 Obesity, unspecified: Secondary | ICD-10-CM | POA: Diagnosis not present

## 2018-03-01 DIAGNOSIS — E785 Hyperlipidemia, unspecified: Secondary | ICD-10-CM

## 2018-03-01 DIAGNOSIS — I10 Essential (primary) hypertension: Secondary | ICD-10-CM | POA: Diagnosis not present

## 2018-03-01 DIAGNOSIS — K7581 Nonalcoholic steatohepatitis (NASH): Secondary | ICD-10-CM | POA: Diagnosis not present

## 2018-03-01 DIAGNOSIS — Z0000 Encounter for general adult medical examination without abnormal findings: Secondary | ICD-10-CM | POA: Diagnosis not present

## 2018-03-01 MED ORDER — HYDROCHLOROTHIAZIDE 25 MG PO TABS: 25.0000 mg | ORAL_TABLET | Freq: Every day | ORAL | 3 refills | Status: DC

## 2018-03-01 NOTE — Assessment & Plan Note (Addendum)
Update LFTs. Anticipate improvement with recent weight loss.

## 2018-03-01 NOTE — Assessment & Plan Note (Signed)
Preventative protocols reviewed and updated unless pt declined. Discussed healthy diet and lifestyle.  

## 2018-03-01 NOTE — Assessment & Plan Note (Addendum)
Congratulated on weight loss to date following low carb diet, encouraged incorporating aerobic exercise into routine to help blood pressure and improve HDL. Pt motivated to continue healthy diet changes

## 2018-03-01 NOTE — Assessment & Plan Note (Signed)
08/2017 confirmed by serology s/p 10d doxycycline treatment. Fully recovered.

## 2018-03-01 NOTE — Patient Instructions (Addendum)
You are doing well today. Congratulations on weight loss! Continue low carb diet, watch fats in diet. Labs today. Return as needed or in 1 year for next physical.  Health Maintenance, Male A healthy lifestyle and preventive care is important for your health and wellness. Ask your health care provider about what schedule of regular examinations is right for you. What should I know about weight and diet? Eat a Healthy Diet  Eat plenty of vegetables, fruits, whole grains, low-fat dairy products, and lean protein.  Do not eat a lot of foods high in solid fats, added sugars, or salt.  Maintain a Healthy Weight Regular exercise can help you achieve or maintain a healthy weight. You should:  Do at least 150 minutes of exercise each week. The exercise should increase your heart rate and make you sweat (moderate-intensity exercise).  Do strength-training exercises at least twice a week.  Watch Your Levels of Cholesterol and Blood Lipids  Have your blood tested for lipids and cholesterol every 5 years starting at 40 years of age. If you are at high risk for heart disease, you should start having your blood tested when you are 40 years old. You may need to have your cholesterol levels checked more often if: ? Your lipid or cholesterol levels are high. ? You are older than 40 years of age. ? You are at high risk for heart disease.  What should I know about cancer screening? Many types of cancers can be detected early and may often be prevented. Lung Cancer  You should be screened every year for lung cancer if: ? You are a current smoker who has smoked for at least 30 years. ? You are a former smoker who has quit within the past 15 years.  Talk to your health care provider about your screening options, when you should start screening, and how often you should be screened.  Colorectal Cancer  Routine colorectal cancer screening usually begins at 40 years of age and should be repeated every  5-10 years until you are 40 years old. You may need to be screened more often if early forms of precancerous polyps or small growths are found. Your health care provider may recommend screening at an earlier age if you have risk factors for colon cancer.  Your health care provider may recommend using home test kits to check for hidden blood in the stool.  A small camera at the end of a tube can be used to examine your colon (sigmoidoscopy or colonoscopy). This checks for the earliest forms of colorectal cancer.  Prostate and Testicular Cancer  Depending on your age and overall health, your health care provider may do certain tests to screen for prostate and testicular cancer.  Talk to your health care provider about any symptoms or concerns you have about testicular or prostate cancer.  Skin Cancer  Check your skin from head to toe regularly.  Tell your health care provider about any new moles or changes in moles, especially if: ? There is a change in a mole's size, shape, or color. ? You have a mole that is larger than a pencil eraser.  Always use sunscreen. Apply sunscreen liberally and repeat throughout the day.  Protect yourself by wearing long sleeves, pants, a wide-brimmed hat, and sunglasses when outside.  What should I know about heart disease, diabetes, and high blood pressure?  If you are 19-59 years of age, have your blood pressure checked every 3-5 years. If you are 40 years  of age or older, have your blood pressure checked every year. You should have your blood pressure measured twice-once when you are at a hospital or clinic, and once when you are not at a hospital or clinic. Record the average of the two measurements. To check your blood pressure when you are not at a hospital or clinic, you can use: ? An automated blood pressure machine at a pharmacy. ? A home blood pressure monitor.  Talk to your health care provider about your target blood pressure.  If you are  between 28-42 years old, ask your health care provider if you should take aspirin to prevent heart disease.  Have regular diabetes screenings by checking your fasting blood sugar level. ? If you are at a normal weight and have a low risk for diabetes, have this test once every three years after the age of 51. ? If you are overweight and have a high risk for diabetes, consider being tested at a younger age or more often.  A one-time screening for abdominal aortic aneurysm (AAA) by ultrasound is recommended for men aged 81-75 years who are current or former smokers. What should I know about preventing infection? Hepatitis B If you have a higher risk for hepatitis B, you should be screened for this virus. Talk with your health care provider to find out if you are at risk for hepatitis B infection. Hepatitis C Blood testing is recommended for:  Everyone born from [birth year] through 1965.  Anyone with known risk factors for hepatitis C.  Sexually Transmitted Diseases (STDs)  You should be screened each year for STDs including gonorrhea and chlamydia if: ? You are sexually active and are younger than 40 years of age. ? You are older than 40 years of age and your health care provider tells you that you are at risk for this type of infection. ? Your sexual activity has changed since you were last screened and you are at an increased risk for chlamydia or gonorrhea. Ask your health care provider if you are at risk.  Talk with your health care provider about whether you are at high risk of being infected with HIV. Your health care provider may recommend a prescription medicine to help prevent HIV infection.  What else can I do?  Schedule regular health, dental, and eye exams.  Stay current with your vaccines (immunizations).  Do not use any tobacco products, such as cigarettes, chewing tobacco, and e-cigarettes. If you need help quitting, ask your health care provider.  Limit alcohol intake to no  more than 2 drinks per day. One drink equals 12 ounces of beer, 5 ounces of wine, or 1 ounces of hard liquor.  Do not use street drugs.  Do not share needles.  Ask your health care provider for help if you need support or information about quitting drugs.  Tell your health care provider if you often feel depressed.  Tell your health care provider if you have ever been abused or do not feel safe at home. This information is not intended to replace advice given to you by your health care provider. Make sure you discuss any questions you have with your health care provider. Document Released: 02/20/2008 Document Revised: 04/22/2016 Document Reviewed: 05/28/2015 Elsevier Interactive Patient Education  [employer].

## 2018-03-01 NOTE — Assessment & Plan Note (Signed)
Chronic off meds - update FLP today.

## 2018-03-01 NOTE — Assessment & Plan Note (Signed)
Chronic, stable. Continue hctz. Encouraged aerobic exercise to improve BP control. Update labs today.

## 2018-03-01 NOTE — Progress Notes (Signed)
BP 132/82 (BP Location: Left Arm, Patient Position: Sitting, Cuff Size: Normal)   Pulse 65   Temp 97.8 F (36.6 C) (Oral)   Ht 6' 1"  (1.854 m)   Wt 228 lb (103.4 kg)   SpO2 98%   BMI 30.08 kg/m   150/90 on repeat   CC: CPE Subjective:    Patient ID: Andrew Phillips, male    DOB: 12-26-1975, 42 y.o.   MRN: [id number]  HPI: Andrew Phillips is a 42 y.o. male presenting on 03/01/2018 for Annual Exam   Lyme disease dx by positive serology 09/2017, resolved after doxy course. He never saw tick bite.  Low carb keto diet over last several months has lost almost 25 lbs!  Has backed off beer. Has found restaurants where he can follow diet (ie greek restaurants).   Preventative: Flu shot - didn't get Tdap 2012 Seat belt use discussed Sunscreen use discussed, no changing moles on skin. 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  Relevant past medical, surgical, family and social history reviewed and updated as indicated. Interim medical history since our last visit reviewed. Allergies and medications reviewed and updated. Outpatient Medications Prior to Visit  Medication Sig Dispense Refill  . hydrochlorothiazide (HYDRODIURIL) 25 MG tablet TAKE 1 TABLET BY MOUTH EVERY DAY 90 tablet 0  . doxycycline (VIBRA-TABS) 100 MG tablet Take 1 tablet (100 mg total) by mouth 2 (two) times daily. 20 tablet 0   No facility-administered medications prior to visit.      Per HPI unless specifically indicated in ROS section below Review of Systems  Constitutional: Negative for activity change, appetite change, chills, fatigue, fever and unexpected weight change.  HENT: Negative for hearing loss.   Eyes: Negative for visual disturbance.  Respiratory: Negative for cough, chest tightness, shortness of  breath and wheezing.   Cardiovascular: Negative for chest pain, palpitations and leg swelling.  Gastrointestinal: Negative for abdominal distention, abdominal pain, blood in stool, constipation, diarrhea, nausea and vomiting.  Genitourinary: Negative for difficulty urinating and hematuria.  Musculoskeletal: Negative for arthralgias, myalgias and neck pain.  Skin: Negative for rash.  Neurological: Negative for dizziness, seizures, syncope and headaches.  Hematological: Negative for adenopathy. Does not bruise/bleed easily.  Psychiatric/Behavioral: Negative for dysphoric mood. The patient is not nervous/anxious.        Objective:    BP 132/82 (BP Location: Left Arm, Patient Position: Sitting, Cuff Size: Normal)   Pulse 65   Temp 97.8 F (36.6 C) (Oral)   Ht 6' 1"  (1.854 m)   Wt 228 lb (103.4 kg)   SpO2 98%   BMI 30.08 kg/m   Wt Readings from Last 3 Encounters:  03/01/18 228 lb (103.4 kg)  09/02/17 251 lb (113.9 kg)  04/22/17 245 lb 8 oz (111.4 kg)    Physical Exam  Constitutional: He is oriented to person, place, and time. He appears well-developed and well-nourished. No distress.  HENT:  Head: Normocephalic and atraumatic.  Right Ear: Hearing, tympanic membrane, external ear and ear canal normal.  Left Ear: Hearing, tympanic membrane, external ear and ear canal normal.  Nose: Nose normal.  Mouth/Throat: Uvula is midline, oropharynx is clear and moist and mucous membranes are normal. No oropharyngeal exudate, posterior oropharyngeal edema or posterior oropharyngeal erythema.  Eyes: Pupils are equal, round, and reactive to light. Conjunctivae and EOM are normal. No scleral icterus.  Neck: Normal range of motion. Neck supple.  Cardiovascular: Normal rate, regular rhythm, normal heart sounds and intact distal pulses.  No murmur heard. Pulses:      Radial pulses are 2+ on the right side, and 2+ on the left side.  Pulmonary/Chest: Effort normal and breath sounds normal. No  respiratory distress. He has no wheezes. He has no rales.  Abdominal: Soft. Bowel sounds are normal. He exhibits no distension and no mass. There is no tenderness. There is no rebound and no guarding.  Musculoskeletal: Normal range of motion. He exhibits no edema.  Lymphadenopathy:    He has no cervical adenopathy.  Neurological: He is alert and oriented to person, place, and time.  CN grossly intact, station and gait intact  Skin: Skin is warm and dry. No rash noted.  Psychiatric: He has a normal mood and affect. His behavior is normal. Judgment and thought content normal.  Nursing note and vitals reviewed.  Results for orders placed or performed in visit on 09/06/17  CBC  Result Value Ref Range   WBC 5.9 4.0 - 10.5 K/uL   RBC 4.05 (L) 4.22 - 5.81 Mil/uL   Platelets 180.0 150.0 - 400.0 K/uL   Hemoglobin 11.6 (L) 13.0 - 17.0 g/dL   HCT 34.7 (L) 39.0 - 52.0 %   MCV 85.6 78.0 - 100.0 fl   MCHC 33.5 30.0 - 36.0 g/dL   RDW 15.0 11.5 - 15.5 %  Rocky mtn spotted fvr abs pnl(IgG+IgM)  Result Value Ref Range   RMSF IgG NOT DETECTED NOT DETECT   RMSF IgM NOT DETECTED NOT DETECT  Lactate Dehydrogenase  Result Value Ref Range   LDH 417 (H) 100 - 220 U/L  Hepatitis panel, acute  Result Value Ref Range   Hep A IgM NON-REACTIVE NON-REACTI   Hepatitis B Surface Ag NON-REACTIVE NON-REACTI   Hep B C IgM NON-REACTIVE NON-REACTI   Hepatitis C Ab NON-REACTIVE NON-REACTI   SIGNAL TO CUT-OFF 0.02 <1.00  TSH  Result Value Ref Range   TSH 2.39 0.35 - 4.50 uIU/mL  Rheumatoid factor  Result Value Ref Range   Rhuematoid fact SerPl-aCnc 32 (H) <14 IU/mL  ANA  Result Value Ref Range   Anti Nuclear Antibody(ANA) NEGATIVE NEGATIVE  B. burgdorfi antibodies by WB  Result Value Ref Range   B burgdorferi IgG Abs (IB) NEGATIVE NEGATIVE   Lyme Disease 18 kD IgG NON-REACTIVE    Lyme Disease 23 kD IgG NON-REACTIVE    Lyme Disease 28 kD IgG NON-REACTIVE    Lyme Disease 30 kD IgG NON-REACTIVE    Lyme  Disease 39 kD IgG NON-REACTIVE    Lyme Disease 41 kD IgG NON-REACTIVE    Lyme Disease 45 kD IgG NON-REACTIVE    Lyme Disease 58 kD IgG NON-REACTIVE    Lyme Disease 66 kD IgG NON-REACTIVE    Lyme Disease 93 kD IgG NON-REACTIVE    B burgdorferi IgM Abs (IB) POSITIVE (A) NEGATIVE   Lyme Disease 23 kD IgM REACTIVE (A)    Lyme Disease 39 kD IgM REACTIVE (A)    Lyme Disease 41 kD IgM REACTIVE (A)       Assessment & Plan:   Problem List Items Addressed This Visit    Obesity, Class I, BMI 30-34.9    Congratulated on weight loss to date following low carb diet, encouraged incorporating aerobic exercise into routine to help blood  pressure and improve HDL. Pt motivated to continue healthy diet changes       NASH (nonalcoholic steatohepatitis)    Update LFTs. Anticipate improvement with recent weight loss.       Relevant Orders   Comprehensive metabolic panel   CBC with Differential/Platelet   TSH   Hypertension    Chronic, stable. Continue hctz. Encouraged aerobic exercise to improve BP control. Update labs today.       Relevant Medications   hydrochlorothiazide (HYDRODIURIL) 25 MG tablet   Other Relevant Orders   Microalbumin / creatinine urine ratio   HLD (hyperlipidemia)    Chronic off meds - update FLP today.       Relevant Medications   hydrochlorothiazide (HYDRODIURIL) 25 MG tablet   Other Relevant Orders   Lipid panel   Comprehensive metabolic panel   TSH   History of Lyme disease    08/2017 confirmed by serology s/p 10d doxycycline treatment. Fully recovered.       Health maintenance examination - Primary    Preventative protocols reviewed and updated unless pt declined. Discussed healthy diet and lifestyle.           Meds ordered this encounter  Medications  . hydrochlorothiazide (HYDRODIURIL) 25 MG tablet    Sig: Take 1 tablet (25 mg total) by mouth daily.    Dispense:  90 tablet    Refill:  3   Orders Placed This Encounter  Procedures  . Lipid panel    . Comprehensive metabolic panel  . CBC with Differential/Platelet  . TSH  . Microalbumin / creatinine urine ratio    Follow up plan: Return in about 1 year (around 03/02/2019) for annual exam, prior fasting for blood work.  Ria Bush, MD

## 2018-03-02 LAB — LIPID PANEL
Cholesterol: 196 mg/dL (ref 0–200)
HDL: 37.5 mg/dL — ABNORMAL LOW (ref >39.00)
LDL Cholesterol: 119 mg/dL — ABNORMAL HIGH (ref 0–99)
NonHDL: 158.57
Total CHOL/HDL Ratio: 5
Triglycerides: 200 mg/dL — ABNORMAL HIGH (ref 0.0–149.0)
VLDL: 40 mg/dL (ref 0.0–40.0)

## 2018-03-02 LAB — COMPREHENSIVE METABOLIC PANEL
ALT: 38 U/L (ref 0–53)
AST: 28 U/L (ref 0–37)
Albumin: 5.2 g/dL (ref 3.5–5.2)
Alkaline Phosphatase: 55 U/L (ref 39–117)
BUN: 17 mg/dL (ref 6–23)
CO2: 27 mEq/L (ref 19–32)
Calcium: 10.6 mg/dL — ABNORMAL HIGH (ref 8.4–10.5)
Chloride: 101 mEq/L (ref 96–112)
Creatinine, Ser: 0.85 mg/dL (ref 0.40–1.50)
GFR: 105.86 mL/min (ref >60.00)
Glucose, Bld: 86 mg/dL (ref 70–99)
Potassium: 4.2 mEq/L (ref 3.5–5.1)
Sodium: 139 mEq/L (ref 135–145)
Total Bilirubin: 0.8 mg/dL (ref 0.2–1.2)
Total Protein: 8.4 g/dL — ABNORMAL HIGH (ref 6.0–8.3)

## 2018-03-02 LAB — CBC WITH DIFFERENTIAL/PLATELET
Basophils Absolute: 0.1 10*3/uL (ref 0.0–0.1)
Basophils Relative: 0.8 % (ref 0.0–3.0)
Eosinophils Absolute: 0.2 10*3/uL (ref 0.0–0.7)
Eosinophils Relative: 2.7 % (ref 0.0–5.0)
HCT: 41.7 % (ref 39.0–52.0)
Hemoglobin: 14.4 g/dL (ref 13.0–17.0)
Lymphocytes Relative: 29.4 % (ref 12.0–46.0)
Lymphs Abs: 1.9 10*3/uL (ref 0.7–4.0)
MCHC: 34.6 g/dL (ref 30.0–36.0)
MCV: 85.5 fl (ref 78.0–100.0)
Monocytes Absolute: 0.4 10*3/uL (ref 0.1–1.0)
Monocytes Relative: 6 % (ref 3.0–12.0)
Neutro Abs: 3.9 10*3/uL (ref 1.4–7.7)
Neutrophils Relative %: 61.1 % (ref 43.0–77.0)
Platelets: 178 10*3/uL (ref 150.0–400.0)
RBC: 4.88 Mil/uL (ref 4.22–5.81)
RDW: 14 % (ref 11.5–15.5)
WBC: 6.3 10*3/uL (ref 4.0–10.5)

## 2018-03-02 LAB — MICROALBUMIN / CREATININE URINE RATIO
Creatinine,U: 66.2 mg/dL
Microalb Creat Ratio: 2.6 mg/g (ref 0.0–30.0)
Microalb, Ur: 1.7 mg/dL (ref 0.0–1.9)

## 2018-03-02 LAB — TSH: TSH: 1.4 u[IU]/mL (ref 0.35–4.50)

## 2018-03-05 ENCOUNTER — Encounter: Payer: Self-pay | Admitting: Family Medicine

## 2018-03-05 ENCOUNTER — Other Ambulatory Visit: Payer: Self-pay | Admitting: Family Medicine

## 2018-03-05 NOTE — Telephone Encounter (Signed)
Results released through [facility]

## 2018-03-14 ENCOUNTER — Encounter: Payer: No Typology Code available for payment source | Admitting: Family Medicine

## 2018-05-04 ENCOUNTER — Ambulatory Visit: Payer: No Typology Code available for payment source | Admitting: Family Medicine

## 2019-01-09 ENCOUNTER — Telehealth: Payer: No Typology Code available for payment source

## 2019-01-09 ENCOUNTER — Ambulatory Visit (INDEPENDENT_AMBULATORY_CARE_PROVIDER_SITE_OTHER): Payer: No Typology Code available for payment source | Admitting: Family Medicine

## 2019-01-09 ENCOUNTER — Encounter: Payer: Self-pay | Admitting: Family Medicine

## 2019-01-09 ENCOUNTER — Other Ambulatory Visit: Payer: Self-pay

## 2019-01-09 VITALS — BP 168/110 | HR 82 | Temp 97.0°F | Ht 73.0 in | Wt 244.0 lb

## 2019-01-09 DIAGNOSIS — I10 Essential (primary) hypertension: Secondary | ICD-10-CM

## 2019-01-09 DIAGNOSIS — E669 Obesity, unspecified: Secondary | ICD-10-CM | POA: Diagnosis not present

## 2019-01-09 MED ORDER — AMLODIPINE BESYLATE 5 MG PO TABS: 5.0000 mg | ORAL_TABLET | Freq: Every day | ORAL | 6 refills | Status: DC

## 2019-01-09 NOTE — Progress Notes (Signed)
Virtual visit completed through [employer]. Due to national recommendations of social distancing due to [place] 19, a virtual visit is felt to be most appropriate for this patient at this time.   Patient location: home Provider location: [place] at [facility], office If any vitals were documented, they were collected by patient at home unless specified below.    BP (!) 168/110 (BP Location: Right Arm)   Pulse 82   Temp (!) 97 F (36.1 C) (Temporal)   Ht 6' 1"  (1.854 m)   Wt 244 lb (110.7 kg)   BMI 32.19 kg/m   BP Readings from Last 3 Encounters:  01/09/19 (!) 168/110  03/01/18 132/82  09/02/17 118/70   again elevated on repeat testing at home  CC: elevated BP Subjective:    Patient ID: BRIGHAM COBBINS, male    DOB: 1975/11/08, 43 y.o.   MRN: [id number]  HPI: RONDALE NIES is a 43 y.o. male presenting on 01/09/2019 for Elevated BP (C/o elevated BP. Readings have been about 150/100. Had HA on 01/07/19. )   Saturday night woke up with headache. BP was also elevated. Otherwise feeling well.   HTN - Compliant with current antihypertensive regimen of hctz 85m daily.  Does not regularly check blood pressures at home: 140-150/100s, today's readings markedly elevated. No low blood pressure readings or symptoms of dizziness/syncope.  Denies vision changes, CP/tightness, SOB, leg swelling.   Weight gain noted over the last 1-2 months (previously followed ketodiet).  Less healthy diet over last 2 months.      Relevant past medical, surgical, family and social history reviewed and updated as indicated. Interim medical history since our last visit reviewed. Allergies and medications reviewed and updated. Outpatient Medications Prior to Visit  Medication Sig Dispense Refill  . hydrochlorothiazide (HYDRODIURIL) 25 MG tablet Take 1 tablet (25 mg total) by mouth daily. 90 tablet 3   No facility-administered medications prior to visit.      Per HPI unless specifically indicated in ROS section below  Review of Systems Objective:    BP (!) 168/110 (BP Location: Right Arm)   Pulse 82   Temp (!) 97 F (36.1 C) (Temporal)   Ht 6' 1"  (1.854 m)   Wt 244 lb (110.7 kg)   BMI 32.19 kg/m   Wt Readings from Last 3 Encounters:  01/09/19 244 lb (110.7 kg)  03/01/18 228 lb (103.4 kg)  09/02/17 251 lb (113.9 kg)     Physical exam: Gen: alert, NAD, not ill appearing Pulm: speaks in complete sentences without increased work of breathing Psych: normal mood, normal thought content      Results for orders placed or performed in visit on 03/01/18  Lipid panel  Result Value Ref Range   Cholesterol 196 0 - 200 mg/dL   Triglycerides 200.0 (H) 0.0 - 149.0 mg/dL   HDL 37.50 (L) >39.00 mg/dL   VLDL 40.0 0.0 - 40.0 mg/dL   LDL Cholesterol 119 (H) 0 - 99 mg/dL   Total CHOL/HDL Ratio 5    NonHDL 158.57   Comprehensive metabolic panel  Result Value Ref Range   Sodium 139 135 - 145 mEq/L   Potassium 4.2 3.5 - 5.1 mEq/L   Chloride 101 96 - 112 mEq/L   CO2 27 19 - 32 mEq/L   Glucose, Bld 86 70 - 99 mg/dL   BUN 17 6 - 23 mg/dL   Creatinine, Ser 0.85 0.40 - 1.50 mg/dL   Total Bilirubin 0.8 0.2 - 1.2 mg/dL  Alkaline Phosphatase 55 39 - 117 U/L   AST 28 0 - 37 U/L   ALT 38 0 - 53 U/L   Total Protein 8.4 (H) 6.0 - 8.3 g/dL   Albumin 5.2 3.5 - 5.2 g/dL   Calcium 10.6 (H) 8.4 - 10.5 mg/dL   GFR 105.86 >60.00 mL/min  CBC with Differential/Platelet  Result Value Ref Range   WBC 6.3 4.0 - 10.5 K/uL   RBC 4.88 4.22 - 5.81 Mil/uL   Hemoglobin 14.4 13.0 - 17.0 g/dL   HCT 41.7 39.0 - 52.0 %   MCV 85.5 78.0 - 100.0 fl   MCHC 34.6 30.0 - 36.0 g/dL   RDW 14.0 11.5 - 15.5 %   Platelets 178.0 150.0 - 400.0 K/uL   Neutrophils Relative % 61.1 43.0 - 77.0 %   Lymphocytes Relative 29.4 12.0 - 46.0 %   Monocytes Relative 6.0 3.0 - 12.0 %   Eosinophils Relative 2.7 0.0 - 5.0 %   Basophils Relative 0.8 0.0 - 3.0 %   Neutro Abs 3.9 1.4 - 7.7 K/uL   Lymphs Abs 1.9 0.7 - 4.0 K/uL   Monocytes Absolute 0.4  0.1 - 1.0 K/uL   Eosinophils Absolute 0.2 0.0 - 0.7 K/uL   Basophils Absolute 0.1 0.0 - 0.1 K/uL  TSH  Result Value Ref Range   TSH 1.40 0.35 - 4.50 uIU/mL  Microalbumin / creatinine urine ratio  Result Value Ref Range   Microalb, Ur 1.7 0.0 - 1.9 mg/dL   Creatinine,U 66.2 mg/dL   Microalb Creat Ratio 2.6 0.0 - 30.0 mg/g   Assessment & Plan:   Problem List Items Addressed This Visit    Obesity, Class I, BMI 30-34.9    Weight gain noted. Will work towards health diet changes and weight loss.       Hypertension - Primary    Chronic, deteriorated. Markedly elevated last few days, unsure how long BP's been elevated. Anticipated driven by recent weight gain, poor dietary choices. Will start amlodipine 53m daily in addtiion to hctz 291mdaily. Reviewed side effects to watch for including ankle swelling. Reassess at 2 wk HTN f/u. Pt agrees with plan.       Relevant Medications   amLODipine (NORVASC) 5 MG tablet       Meds ordered this encounter  Medications  . amLODipine (NORVASC) 5 MG tablet    Sig: Take 1 tablet (5 mg total) by mouth daily.    Dispense:  30 tablet    Refill:  6   No orders of the defined types were placed in this encounter.   Follow up plan: Return in about 2 weeks (around 01/23/2019), or if symptoms worsen or fail to improve, for follow up visit.  JaRia BushMD

## 2019-01-09 NOTE — Assessment & Plan Note (Signed)
Weight gain noted. Will work towards health diet changes and weight loss.

## 2019-01-09 NOTE — Assessment & Plan Note (Signed)
Chronic, deteriorated. Markedly elevated last few days, unsure how long BP's been elevated. Anticipated driven by recent weight gain, poor dietary choices. Will start amlodipine 36m daily in addtiion to hctz 238mdaily. Reviewed side effects to watch for including ankle swelling. Reassess at 2 wk HTN f/u. Pt agrees with plan.

## 2019-01-09 NOTE — Telephone Encounter (Signed)
Seen for virtual visit today.

## 2019-01-23 ENCOUNTER — Encounter: Payer: Self-pay | Admitting: Family Medicine

## 2019-01-23 ENCOUNTER — Telehealth (INDEPENDENT_AMBULATORY_CARE_PROVIDER_SITE_OTHER): Payer: No Typology Code available for payment source | Admitting: Family Medicine

## 2019-01-23 VITALS — BP 146/100 | HR 80 | Temp 98.5°F | Ht 73.0 in | Wt 242.0 lb

## 2019-01-23 DIAGNOSIS — E669 Obesity, unspecified: Secondary | ICD-10-CM | POA: Diagnosis not present

## 2019-01-23 DIAGNOSIS — K7581 Nonalcoholic steatohepatitis (NASH): Secondary | ICD-10-CM | POA: Diagnosis not present

## 2019-01-23 DIAGNOSIS — E785 Hyperlipidemia, unspecified: Secondary | ICD-10-CM | POA: Diagnosis not present

## 2019-01-23 DIAGNOSIS — I10 Essential (primary) hypertension: Secondary | ICD-10-CM

## 2019-01-23 MED ORDER — AMLODIPINE BESYLATE 10 MG PO TABS: 10.0000 mg | ORAL_TABLET | Freq: Every day | ORAL | 1 refills | Status: DC

## 2019-01-23 NOTE — Assessment & Plan Note (Addendum)
Chronic, improved but not yet at goal. Increase amlodipine to 90m daily. Reassess at CPE next month. Pt agrees with plan. Update with questions via MyChart PRN in interim.

## 2019-01-23 NOTE — Progress Notes (Signed)
Virtual visit completed through [facility]. Due to national recommendations of social distancing due to COVID-19, a virtual visit is felt to be most appropriate for this patient at this time.   Patient location: home Provider location: [place] at [facility], office If any vitals were documented, they were collected by patient at home unless specified below.    BP (!) 146/100 (BP Location: Right Arm)   Pulse 80   Temp 98.5 F (36.9 C) (Oral)   Ht 6' 1"  (1.854 m)   Wt 242 lb (109.8 kg)   BMI 31.93 kg/m    CC: 2wk HTN f/u Subjective:    Patient ID: Andrew Phillips, male    DOB: 06-24-1976, 43 y.o.   MRN: [id number]  HPI: Andrew Phillips is a 43 y.o. male presenting on 01/23/2019 for Hypertension (F/u.  States BP is running 140s/90s-100s.)   HTN - Compliant with current antihypertensive regimen of hctz 36m daily, amlodipine 517mdaily. Last visit we started amlodipine due to markedly elevated BP readings when he checked at home associated with HA. Does check blood pressures at home: slowly improving along with healthy diet changes (more fruits/vegetables, less sodium) however persistently elevated readings noted. No low blood pressure readings or symptoms of dizziness/syncope. Denies HA, vision changes, CP/tightness, SOB, leg swelling.        Relevant past medical, surgical, family and social history reviewed and updated as indicated. Interim medical history since our last visit reviewed. Allergies and medications reviewed and updated. Outpatient Medications Prior to Visit  Medication Sig Dispense Refill  . hydrochlorothiazide (HYDRODIURIL) 25 MG tablet Take 1 tablet (25 mg total) by mouth daily. 90 tablet 3  . amLODipine (NORVASC) 5 MG tablet Take 1 tablet (5 mg total) by mouth daily. 30 tablet 6   No facility-administered medications prior to visit.      Per HPI unless specifically indicated in ROS section below Review of Systems Objective:    BP (!) 146/100 (BP Location: Right Arm)    Pulse 80   Temp 98.5 F (36.9 C) (Oral)   Ht 6' 1"  (1.854 m)   Wt 242 lb (109.8 kg)   BMI 31.93 kg/m   Wt Readings from Last 3 Encounters:  01/23/19 242 lb (109.8 kg)  01/09/19 244 lb (110.7 kg)  03/01/18 228 lb (103.4 kg)     Physical exam: Gen: alert, NAD, not ill appearing Pulm: speaks in complete sentences without increased work of breathing Psych: normal mood, normal thought content       Assessment & Plan:  CPE labs ordered. Problem List Items Addressed This Visit    Obesity, Class I, BMI 30-34.9   NASH (nonalcoholic steatohepatitis)   Relevant Orders   Comprehensive metabolic panel   HLD (hyperlipidemia)   Relevant Medications   amLODipine (NORVASC) 10 MG tablet   Other Relevant Orders   Lipid panel   Comprehensive metabolic panel   TSH   Essential hypertension - Primary    Chronic, improved but not yet at goal. Increase amlodipine to 1064maily. Reassess at CPE next month. Pt agrees with plan. Update with questions via MyChart PRN in interim.       Relevant Medications   amLODipine (NORVASC) 10 MG tablet   Other Relevant Orders   Microalbumin / creatinine urine ratio       Meds ordered this encounter  Medications  . amLODipine (NORVASC) 10 MG tablet    Sig: Take 1 tablet (10 mg total) by mouth daily.  Dispense:  90 tablet    Refill:  1   Orders Placed This Encounter  Procedures  . Lipid panel    Standing Status:   Future    Standing Expiration Date:   01/23/2020  . Comprehensive metabolic panel    Standing Status:   Future    Standing Expiration Date:   01/23/2020  . TSH    Standing Status:   Future    Standing Expiration Date:   01/23/2020  . Microalbumin / creatinine urine ratio    Standing Status:   Future    Standing Expiration Date:   01/23/2020    Follow up plan: No follow-ups on file.  Ria Bush, MD

## 2019-02-10 IMAGING — DX DG CHEST 2V
2 series · 2 of 2 positions shown · non-contrast
Comparison: None.

CLINICAL DATA: Fever for 10 days

EXAM:
CHEST  2 VIEW

[chest pa]
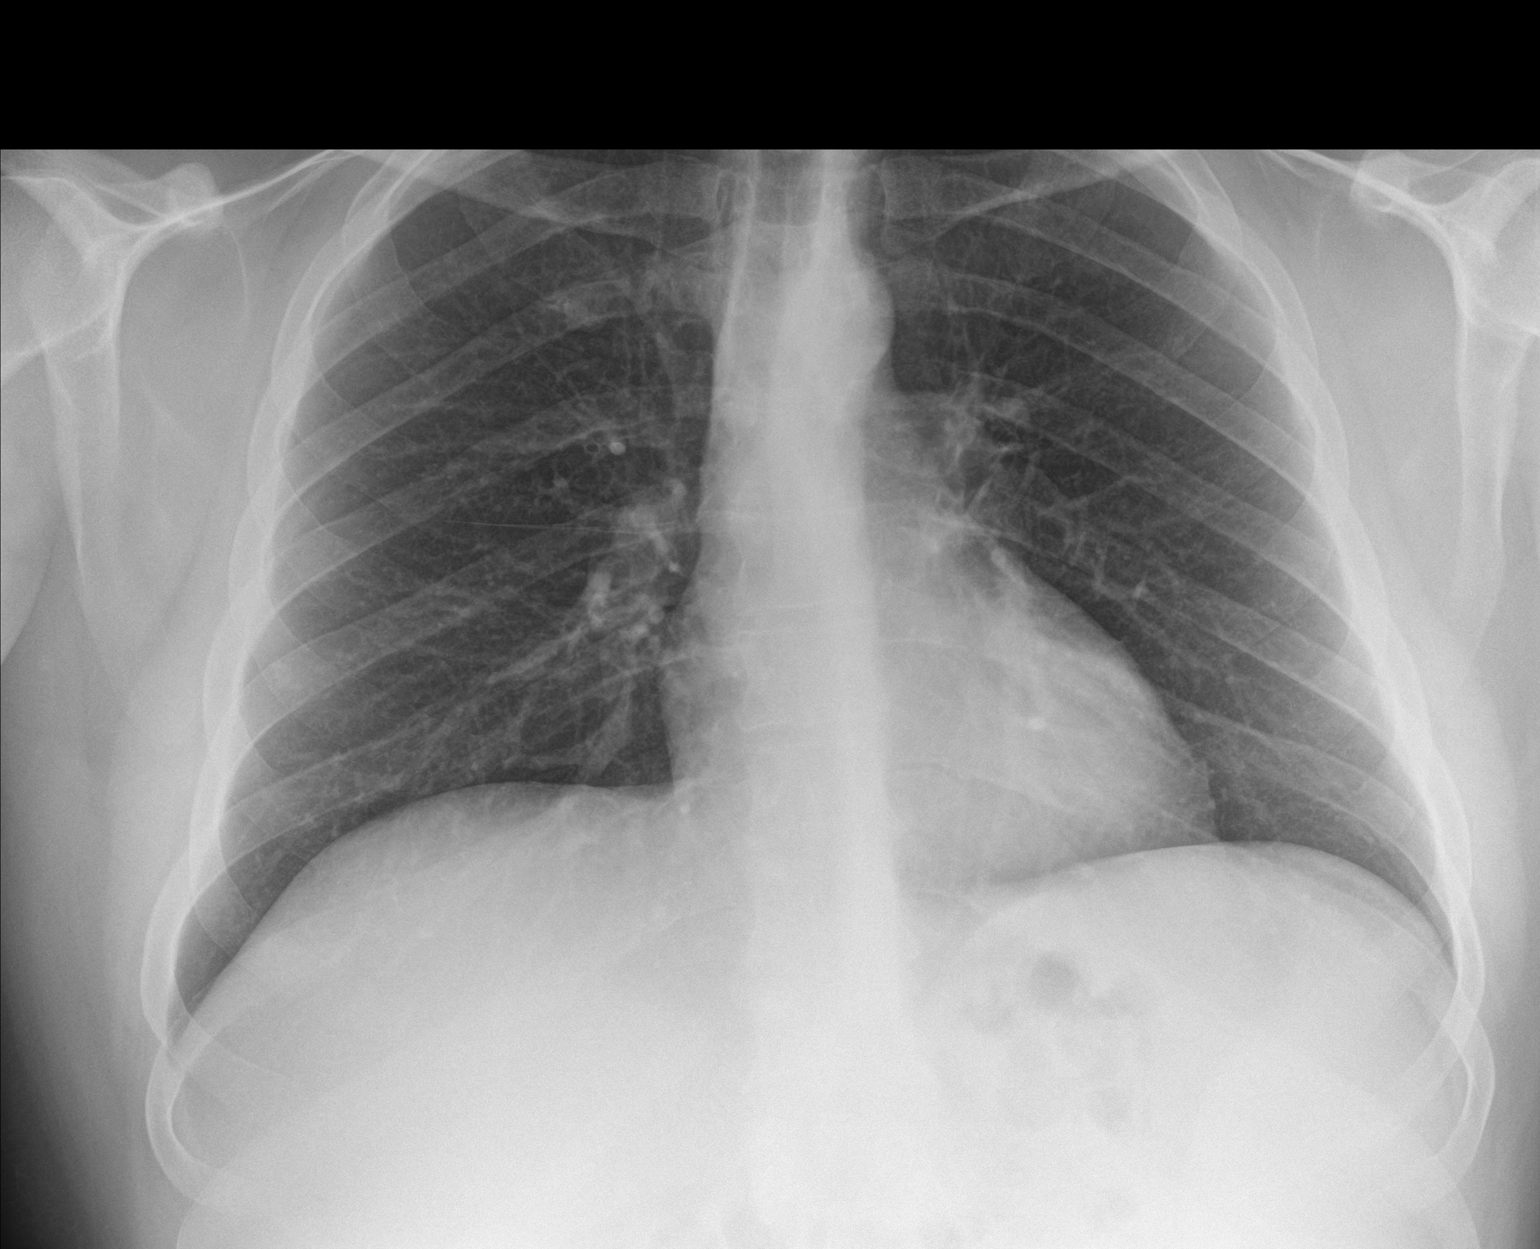

[chest lat]
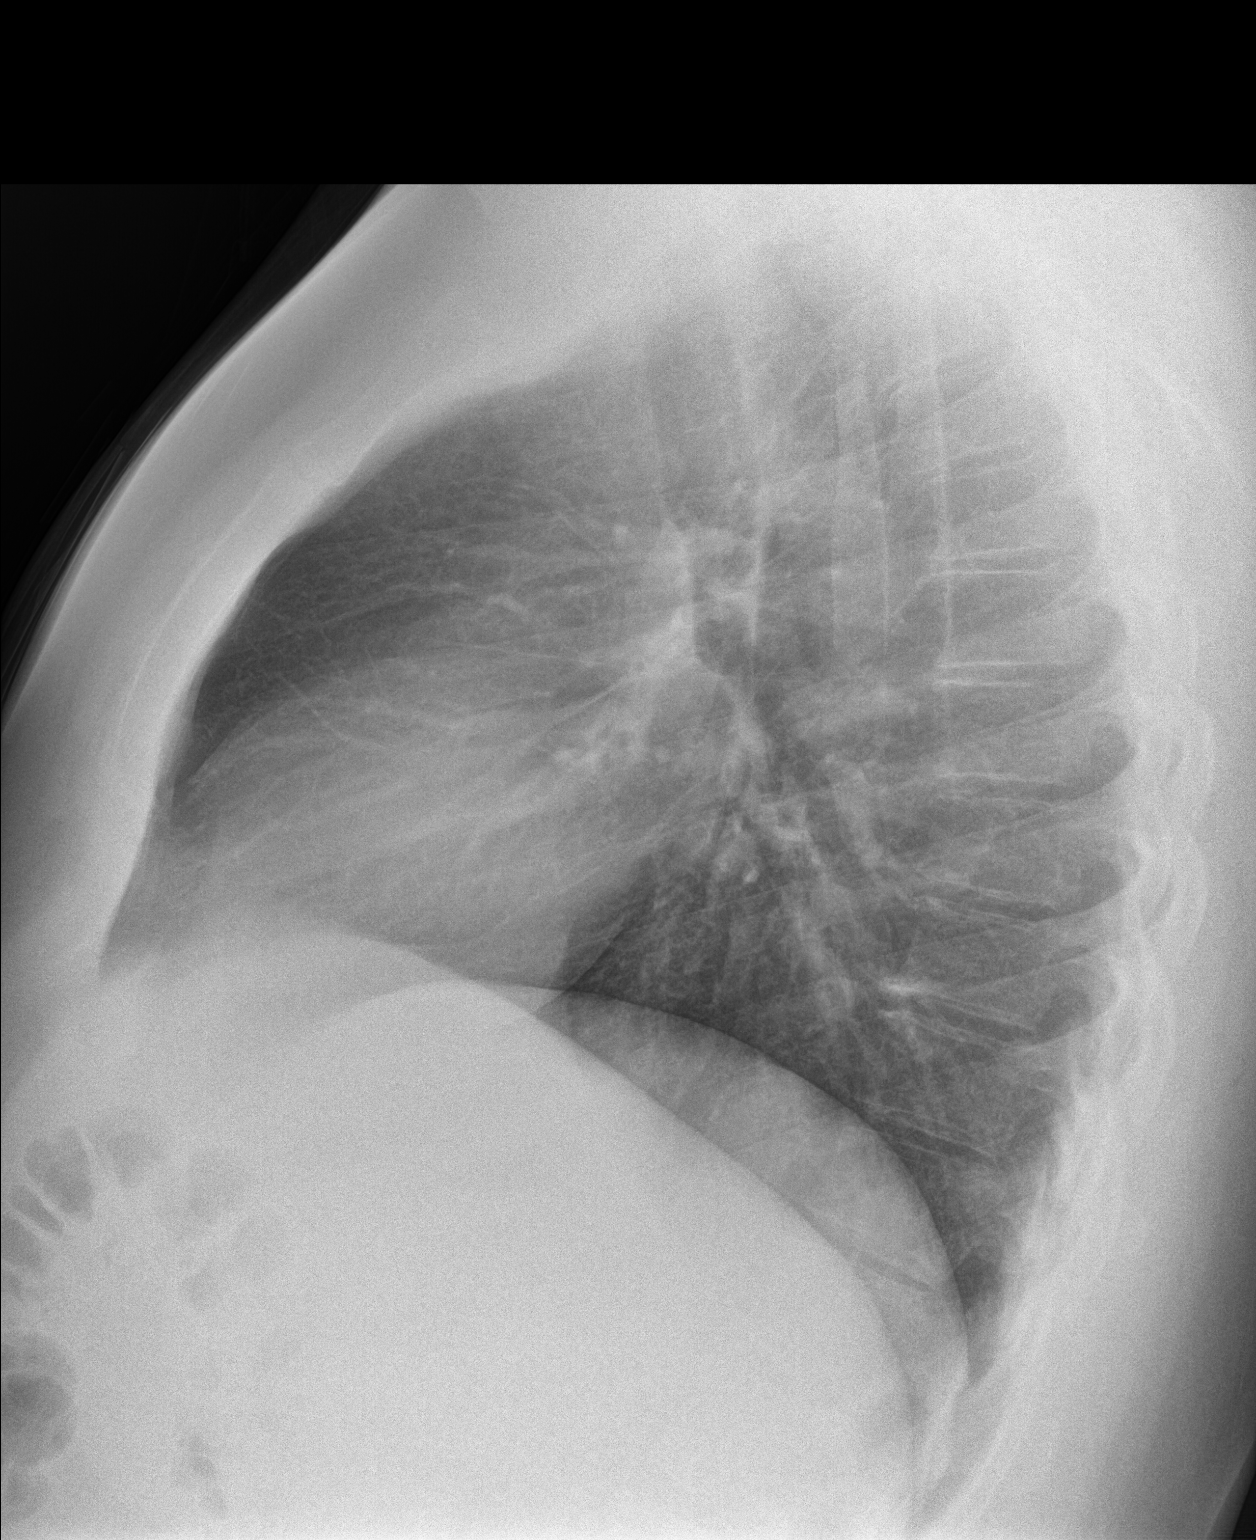

[2 of 2 positions shown; findings below may reference images not displayed]

FINDINGS: The heart size and mediastinal contours are within normal limits.
Both lungs are clear. The visualized skeletal structures are
unremarkable.
IMPRESSION: No active cardiopulmonary disease.

## 2019-02-27 ENCOUNTER — Other Ambulatory Visit (INDEPENDENT_AMBULATORY_CARE_PROVIDER_SITE_OTHER): Payer: No Typology Code available for payment source

## 2019-02-27 DIAGNOSIS — E785 Hyperlipidemia, unspecified: Secondary | ICD-10-CM

## 2019-02-27 DIAGNOSIS — I10 Essential (primary) hypertension: Secondary | ICD-10-CM

## 2019-02-27 DIAGNOSIS — K7581 Nonalcoholic steatohepatitis (NASH): Secondary | ICD-10-CM | POA: Diagnosis not present

## 2019-02-27 LAB — MICROALBUMIN / CREATININE URINE RATIO
Creatinine,U: 130.3 mg/dL
Microalb Creat Ratio: 5.5 mg/g (ref 0.0–30.0)
Microalb, Ur: 7.1 mg/dL — ABNORMAL HIGH (ref 0.0–1.9)

## 2019-02-27 LAB — LIPID PANEL
Cholesterol: 180 mg/dL (ref 0–200)
HDL: 34.9 mg/dL — ABNORMAL LOW (ref >39.00)
LDL Cholesterol: 119 mg/dL — ABNORMAL HIGH (ref 0–99)
NonHDL: 144.87
Total CHOL/HDL Ratio: 5
Triglycerides: 128 mg/dL (ref 0.0–149.0)
VLDL: 25.6 mg/dL (ref 0.0–40.0)

## 2019-02-27 LAB — COMPREHENSIVE METABOLIC PANEL
ALT: 40 U/L (ref 0–53)
AST: 25 U/L (ref 0–37)
Albumin: 5.2 g/dL (ref 3.5–5.2)
Alkaline Phosphatase: 57 U/L (ref 39–117)
BUN: 20 mg/dL (ref 6–23)
CO2: 28 mEq/L (ref 19–32)
Calcium: 9.8 mg/dL (ref 8.4–10.5)
Chloride: 100 mEq/L (ref 96–112)
Creatinine, Ser: 0.94 mg/dL (ref 0.40–1.50)
GFR: 88.24 mL/min (ref >60.00)
Glucose, Bld: 103 mg/dL — ABNORMAL HIGH (ref 70–99)
Potassium: 3.9 mEq/L (ref 3.5–5.1)
Sodium: 136 mEq/L (ref 135–145)
Total Bilirubin: 0.9 mg/dL (ref 0.2–1.2)
Total Protein: 7.5 g/dL (ref 6.0–8.3)

## 2019-02-27 LAB — HEPATIC FUNCTION PANEL
ALT: 40 U/L (ref 0–53)
AST: 25 U/L (ref 0–37)
Albumin: 5.2 g/dL (ref 3.5–5.2)
Alkaline Phosphatase: 57 U/L (ref 39–117)
Bilirubin, Direct: 0.2 mg/dL (ref 0.0–0.3)
Total Bilirubin: 0.9 mg/dL (ref 0.2–1.2)
Total Protein: 7.5 g/dL (ref 6.0–8.3)

## 2019-02-27 LAB — TSH: TSH: 2.69 u[IU]/mL (ref 0.35–4.50)

## 2019-02-27 MED FILL — AMLODIPINE BESYLATE 10 MG T: 10 | 90 days supply | Qty: 90 | Fill #0

## 2019-02-28 ENCOUNTER — Other Ambulatory Visit: Payer: Self-pay | Admitting: *Deleted

## 2019-02-28 MED ORDER — HYDROCHLOROTHIAZIDE 25 MG PO TABS: 25.0000 mg | ORAL_TABLET | Freq: Every day | ORAL | 0 refills | Status: DC

## 2019-02-28 MED FILL — HYDROCHLOROTHIAZIDE 25 MG T: 25 | 90 days supply | Qty: 90 | Fill #0

## 2019-02-28 NOTE — Telephone Encounter (Signed)
Message left on voicemail from staff member at [facility] stating that they were trying to transfer refills to them, but patient is out of refills. Message left to send new script for HCTZ to [facility].

## 2019-02-28 NOTE — Telephone Encounter (Signed)
Rx sent 

## 2019-03-06 ENCOUNTER — Encounter: Payer: Self-pay | Admitting: Family Medicine

## 2019-03-06 ENCOUNTER — Ambulatory Visit (INDEPENDENT_AMBULATORY_CARE_PROVIDER_SITE_OTHER): Payer: No Typology Code available for payment source | Admitting: Family Medicine

## 2019-03-06 ENCOUNTER — Other Ambulatory Visit: Payer: Self-pay

## 2019-03-06 VITALS — BP 130/82 | HR 71 | Temp 97.9°F | Ht 72.25 in | Wt 233.4 lb

## 2019-03-06 DIAGNOSIS — K7581 Nonalcoholic steatohepatitis (NASH): Secondary | ICD-10-CM

## 2019-03-06 DIAGNOSIS — I10 Essential (primary) hypertension: Secondary | ICD-10-CM

## 2019-03-06 DIAGNOSIS — Z0000 Encounter for general adult medical examination without abnormal findings: Secondary | ICD-10-CM

## 2019-03-06 DIAGNOSIS — E782 Mixed hyperlipidemia: Secondary | ICD-10-CM

## 2019-03-06 DIAGNOSIS — E669 Obesity, unspecified: Secondary | ICD-10-CM | POA: Diagnosis not present

## 2019-03-06 MED ORDER — HYDROCHLOROTHIAZIDE 25 MG PO TABS: 25.0000 mg | ORAL_TABLET | Freq: Every day | ORAL | 3 refills | Status: DC

## 2019-03-06 MED ORDER — AMLODIPINE BESYLATE 10 MG PO TABS: 10.0000 mg | ORAL_TABLET | Freq: Every day | ORAL | 3 refills | Status: DC

## 2019-03-06 NOTE — Assessment & Plan Note (Signed)
Congratulated on weight loss to date. Pt motivated to continue low carb diet.

## 2019-03-06 NOTE — Progress Notes (Signed)
This visit was conducted in person.  BP 130/82 (BP Location: Left Arm, Patient Position: Sitting, Cuff Size: Large)   Pulse 71   Temp 97.9 F (36.6 C) (Temporal)   Ht 6' 0.25" (1.835 m)   Wt 233 lb 7 oz (105.9 kg)   SpO2 98%   BMI 31.44 kg/m    CC: CPE Subjective:    Patient ID: Andrew Phillips, male    DOB: February 27, 1976, 43 y.o.   MRN: [id number]  HPI: Andrew Phillips is a 43 y.o. male presenting on 03/06/2019 for Annual Exam   11 lb weight loss over the last 2 month! Following low carb diet.  Home BP readings well controlled 125/75-85.   Preventative: Flu shot - didn't get Tdap 2012 Seat belt use discussed  Sunscreen use discussed, no changing moles on skin.  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 Interim medical history since our last visit reviewed. Allergies and medications reviewed and updated. Outpatient Medications Prior to Visit  Medication Sig Dispense Refill  . amLODipine (NORVASC) 10 MG tablet Take 1 tablet (10 mg total) by mouth daily. 90 tablet 1  . hydrochlorothiazide (HYDRODIURIL) 25 MG tablet Take 1 tablet (25 mg total) by mouth daily. 90 tablet 0   No facility-administered medications prior to visit.      Per HPI unless specifically indicated in ROS section below Review of Systems  Constitutional: Negative for activity change, appetite change, chills, fatigue, fever and unexpected weight change.  HENT: Negative for hearing loss.   Eyes: Negative for visual disturbance.  Respiratory: Negative for cough, chest tightness, shortness of breath and wheezing.   Cardiovascular: Negative for chest pain, palpitations and leg  swelling.  Gastrointestinal: Negative for abdominal distention, abdominal pain, blood in stool, constipation, diarrhea, nausea and vomiting.  Genitourinary: Negative for difficulty urinating and hematuria.  Musculoskeletal: Negative for arthralgias, myalgias and neck pain.  Skin: Negative for rash.  Neurological: Negative for dizziness, seizures, syncope and headaches.  Hematological: Negative for adenopathy. Does not bruise/bleed easily.  Psychiatric/Behavioral: Negative for dysphoric mood. The patient is not nervous/anxious.    Objective:    BP 130/82 (BP Location: Left Arm, Patient Position: Sitting, Cuff Size: Large)   Pulse 71   Temp 97.9 F (36.6 C) (Temporal)   Ht 6' 0.25" (1.835 m)   Wt 233 lb 7 oz (105.9 kg)   SpO2 98%   BMI 31.44 kg/m   Wt Readings from Last 3 Encounters:  03/06/19 233 lb 7 oz (105.9 kg)  01/23/19 242 lb (109.8 kg)  01/09/19 244 lb (110.7 kg)    Physical Exam Vitals signs and nursing note reviewed.  Constitutional:      General: He is not in acute distress.    Appearance: Normal appearance. He is well-developed.  HENT:     Head: Normocephalic and atraumatic.     Right Ear: Hearing, tympanic membrane, ear canal and external ear normal.     Left Ear: Hearing, tympanic membrane, ear canal and external ear normal.     Nose: Nose normal.     Mouth/Throat:     Mouth: Mucous membranes  are moist.     Pharynx: Uvula midline. No oropharyngeal exudate or posterior oropharyngeal erythema.  Eyes:     General: No scleral icterus.    Extraocular Movements: Extraocular movements intact.     Conjunctiva/sclera: Conjunctivae normal.     Pupils: Pupils are equal, round, and reactive to light.  Neck:     Musculoskeletal: Normal range of motion and neck supple.  Cardiovascular:     Rate and Rhythm: Normal rate and regular rhythm.     Pulses: Normal pulses.          Radial pulses are 2+ on the right side and 2+ on the left side.     Heart sounds: Normal heart  sounds. No murmur.  Pulmonary:     Effort: Pulmonary effort is normal. No respiratory distress.     Breath sounds: Normal breath sounds. No wheezing, rhonchi or rales.  Abdominal:     General: Bowel sounds are normal. There is no distension.     Palpations: Abdomen is soft. There is no mass.     Tenderness: There is no abdominal tenderness. There is no guarding or rebound.  Musculoskeletal: Normal range of motion.     Right lower leg: No edema.     Left lower leg: No edema.  Lymphadenopathy:     Cervical: No cervical adenopathy.  Skin:    General: Skin is warm and dry.     Findings: No rash.  Neurological:     General: No focal deficit present.     Mental Status: He is alert and oriented to person, place, and time.     Comments: CN grossly intact, station and gait intact  Psychiatric:        Mood and Affect: Mood normal.        Behavior: Behavior normal.        Thought Content: Thought content normal.        Judgment: Judgment normal.       Results for orders placed or performed in visit on 02/27/19  Microalbumin / creatinine urine ratio  Result Value Ref Range   Microalb, Ur 7.1 (H) 0.0 - 1.9 mg/dL   Creatinine,U 130.3 mg/dL   Microalb Creat Ratio 5.5 0.0 - 30.0 mg/g  TSH  Result Value Ref Range   TSH 2.69 0.35 - 4.50 uIU/mL  Comprehensive metabolic panel  Result Value Ref Range   Sodium 136 135 - 145 mEq/L   Potassium 3.9 3.5 - 5.1 mEq/L   Chloride 100 96 - 112 mEq/L   CO2 28 19 - 32 mEq/L   Glucose, Bld 103 (H) 70 - 99 mg/dL   BUN 20 6 - 23 mg/dL   Creatinine, Ser 0.94 0.40 - 1.50 mg/dL   Total Bilirubin 0.9 0.2 - 1.2 mg/dL   Alkaline Phosphatase 57 39 - 117 U/L   AST 25 0 - 37 U/L   ALT 40 0 - 53 U/L   Total Protein 7.5 6.0 - 8.3 g/dL   Albumin 5.2 3.5 - 5.2 g/dL   Calcium 9.8 8.4 - 10.5 mg/dL   GFR 88.24 >60.00 mL/min  Lipid panel  Result Value Ref Range   Cholesterol 180 0 - 200 mg/dL   Triglycerides 128.0 0.0 - 149.0 mg/dL   HDL 34.90 (L) >39.00 mg/dL    VLDL 25.6 0.0 - 40.0 mg/dL   LDL Cholesterol 119 (H) 0 - 99 mg/dL   Total CHOL/HDL Ratio 5    NonHDL 144.87   Hepatic function panel  Result  Value Ref Range   Total Bilirubin 0.9 0.2 - 1.2 mg/dL   Bilirubin, Direct 0.2 0.0 - 0.3 mg/dL   Alkaline Phosphatase 57 39 - 117 U/L   AST 25 0 - 37 U/L   ALT 40 0 - 53 U/L   Total Protein 7.5 6.0 - 8.3 g/dL   Albumin 5.2 3.5 - 5.2 g/dL   Assessment & Plan:   Problem List Items Addressed This Visit    Obesity, Class I, BMI 30-34.9    Congratulated on weight loss to date. Pt motivated to continue low carb diet.       NASH (nonalcoholic steatohepatitis)    LFTs have improved with weight loss.       HLD (hyperlipidemia)    Chronic, mild. Continue to monitor. The 10-year ASCVD risk score Mikey Bussing DC Brooke Bonito., et al., 2013) is: 2.1%   Values used to calculate the score:     Age: 32 years     Sex: Male     Is Non-Hispanic African American: No     Diabetic: No     Tobacco smoker: No     Systolic Blood Pressure: 672 mmHg     Is BP treated: Yes     HDL Cholesterol: 34.9 mg/dL     Total Cholesterol: 180 mg/dL       Relevant Medications   amLODipine (NORVASC) 10 MG tablet   hydrochlorothiazide (HYDRODIURIL) 25 MG tablet   Health maintenance examination - Primary    Preventative protocols reviewed and updated unless pt declined. Discussed healthy diet and lifestyle.       Essential hypertension    Chronic, stable. Continue current regimen.       Relevant Medications   amLODipine (NORVASC) 10 MG tablet   hydrochlorothiazide (HYDRODIURIL) 25 MG tablet       Meds ordered this encounter  Medications  . amLODipine (NORVASC) 10 MG tablet    Sig: Take 1 tablet (10 mg total) by mouth daily.    Dispense:  90 tablet    Refill:  3  . hydrochlorothiazide (HYDRODIURIL) 25 MG tablet    Sig: Take 1 tablet (25 mg total) by mouth daily.    Dispense:  90 tablet    Refill:  3   No orders of the defined types were placed in this encounter.    Follow up plan: Return in about 1 year (around 03/05/2020) for annual exam, prior fasting for blood work.  Ria Bush, MD

## 2019-03-06 NOTE — Patient Instructions (Addendum)
You are doing well today. Congrats on weight loss! Continue current medicines.  Let me know if BP consistently <110/60. Return as needed or in 1 year for next physical.   Health Maintenance, Male Adopting a healthy lifestyle and getting preventive care are important in promoting health and wellness. Ask your health care provider about:  The right schedule for you to have regular tests and exams.  Things you can do on your own to prevent diseases and keep yourself healthy. What should I know about diet, weight, and exercise? Eat a healthy diet   Eat a diet that includes plenty of vegetables, fruits, low-fat dairy products, and lean protein.  Do not eat a lot of foods that are high in solid fats, added sugars, or sodium. Maintain a healthy weight Body mass index (BMI) is a measurement that can be used to identify possible weight problems. It estimates body fat based on height and weight. Your health care provider can help determine your BMI and help you achieve or maintain a healthy weight. Get regular exercise Get regular exercise. This is one of the most important things you can do for your health. Most adults should:  Exercise for at least 150 minutes each week. The exercise should increase your heart rate and make you sweat (moderate-intensity exercise).  Do strengthening exercises at least twice a week. This is in addition to the moderate-intensity exercise.  Spend less time sitting. Even light physical activity can be beneficial. Watch cholesterol and blood lipids Have your blood tested for lipids and cholesterol at 41 years of age, then have this test every 5 years. You may need to have your cholesterol levels checked more often if:  Your lipid or cholesterol levels are high.  You are older than 41 years of age.  You are at high risk for heart disease. What should I know about cancer screening? Many types of cancers can be detected early and may often be prevented.  Depending on your health history and family history, you may need to have cancer screening at various ages. This may include screening for:  Colorectal cancer.  Prostate cancer.  Skin cancer.  Lung cancer. What should I know about heart disease, diabetes, and high blood pressure? Blood pressure and heart disease  High blood pressure causes heart disease and increases the risk of stroke. This is more likely to develop in people who have high blood pressure readings, are of African descent, or are overweight.  Talk with your health care provider about your target blood pressure readings.  Have your blood pressure checked: ? Every 3-5 years if you are 41-78 years of age. ? Every year if you are 102 years old or older.  If you are between the ages of 32 and 10 and are a current or former smoker, ask your health care provider if you should have a one-time screening for abdominal aortic aneurysm (AAA). Diabetes Have regular diabetes screenings. This checks your fasting blood sugar level. Have the screening done:  Once every three years after age 36 if you are at a normal weight and have a low risk for diabetes.  More often and at a younger age if you are overweight or have a high risk for diabetes. What should I know about preventing infection? Hepatitis B If you have a higher risk for hepatitis B, you should be screened for this virus. Talk with your health care provider to find out if you are at risk for hepatitis B infection. Hepatitis C  Blood testing is recommended for:  Everyone born from [birth year] through 1963.  Anyone with known risk factors for hepatitis C. Sexually transmitted infections (STIs)  You should be screened each year for STIs, including gonorrhea and chlamydia, if: ? You are sexually active and are younger than 41 years of age. ? You are older than 41 years of age and your health care provider tells you that you are at risk for this type of infection. ? Your sexual  activity has changed since you were last screened, and you are at increased risk for chlamydia or gonorrhea. Ask your health care provider if you are at risk.  Ask your health care provider about whether you are at high risk for HIV. Your health care provider may recommend a prescription medicine to help prevent HIV infection. If you choose to take medicine to prevent HIV, you should first get tested for HIV. You should then be tested every 3 months for as long as you are taking the medicine. Follow these instructions at home: Lifestyle  Do not use any products that contain nicotine or tobacco, such as cigarettes, e-cigarettes, and chewing tobacco. If you need help quitting, ask your health care provider.  Do not use street drugs.  Do not share needles.  Ask your health care provider for help if you need support or information about quitting drugs. Alcohol use  Do not drink alcohol if your health care provider tells you not to drink.  If you drink alcohol: ? Limit how much you have to 0-2 drinks a day. ? Be aware of how much alcohol is in your drink. In the [place] one drink equals one 12 oz bottle of beer (355 mL), one 5 oz glass of wine (148 mL), or one 1 oz glass of hard liquor (44 mL). General instructions  Schedule regular health, dental, and eye exams.  Stay current with your vaccines.  Tell your health care provider if: ? You often feel depressed. ? You have ever been abused or do not feel safe at home. Summary  Adopting a healthy lifestyle and getting preventive care are important in promoting health and wellness.  Follow your health care provider's instructions about healthy diet, exercising, and getting tested or screened for diseases.  Follow your health care provider's instructions on monitoring your cholesterol and blood pressure. This information is not intended to replace advice given to you by your health care provider. Make sure you discuss any questions you have  with your health care provider. Document Released: 02/20/2008 Document Revised: 08/17/2018 Document Reviewed: 08/17/2018 Elsevier Patient Education  2020 [employer].

## 2019-03-06 NOTE — Assessment & Plan Note (Signed)
Chronic, stable. Continue current regimen. 

## 2019-03-06 NOTE — Assessment & Plan Note (Signed)
Chronic, mild. Continue to monitor. The 10-year ASCVD risk score Mikey Bussing DC Brooke Bonito., et al., 2013) is: 2.1%   Values used to calculate the score:     Age: 43 years     Sex: Male     Is Non-Hispanic African American: No     Diabetic: No     Tobacco smoker: No     Systolic Blood Pressure: 808 mmHg     Is BP treated: Yes     HDL Cholesterol: 34.9 mg/dL     Total Cholesterol: 180 mg/dL

## 2019-03-06 NOTE — Assessment & Plan Note (Signed)
LFTs have improved with weight loss.

## 2019-03-06 NOTE — Assessment & Plan Note (Signed)
Preventative protocols reviewed and updated unless pt declined. Discussed healthy diet and lifestyle.  

## 2019-03-15 ENCOUNTER — Other Ambulatory Visit: Payer: Self-pay | Admitting: Family Medicine

## 2019-04-12 ENCOUNTER — Encounter: Payer: Self-pay | Admitting: Family Medicine

## 2019-05-17 ENCOUNTER — Other Ambulatory Visit: Payer: Self-pay

## 2019-05-17 ENCOUNTER — Encounter: Payer: Self-pay | Admitting: Family Medicine

## 2019-05-17 ENCOUNTER — Ambulatory Visit (INDEPENDENT_AMBULATORY_CARE_PROVIDER_SITE_OTHER): Payer: No Typology Code available for payment source | Admitting: Family Medicine

## 2019-05-17 VITALS — BP 132/90 | HR 80 | Temp 98.3°F | Wt 244.0 lb

## 2019-05-17 DIAGNOSIS — M79672 Pain in left foot: Secondary | ICD-10-CM | POA: Diagnosis not present

## 2019-05-17 LAB — URIC ACID: Uric Acid, Serum: 7.2 mg/dL (ref 4.0–7.8)

## 2019-05-17 NOTE — Progress Notes (Signed)
Subjective:    Patient ID: Andrew Phillips, male    DOB: July 14, 1976, 43 y.o.   MRN: [id number]  HPI This is a 43 yo male who presents today with 2 days of left foot pain, around toes. No injury. Third - fifth toes tender on top of toes, not sole of foot. Took a dose of ibuprofen earlier today, some relief.  Had similar pain in right foot last month. Relieved in 2 days with ibuprofen. Looking back, he has occasional, similar episodes, usually more spaced apart and resolve in a couple of days. No known food triggers, no known  Takes hctz x many years.   Past Medical History:  Diagnosis Date  . History of kidney stones   . HLD (hyperlipidemia)   . Hypertension   . NASH (nonalcoholic steatohepatitis)    Past Surgical History:  Procedure Laterality Date  . APPENDECTOMY    . VASECTOMY  2018  . WISDOM TOOTH EXTRACTION     Family History  Problem Relation Age of Onset  . Hypertension Mother   . Hypertension Father   . Hyperlipidemia Father   . Heart attack Maternal Grandfather 60       smoker  . Dementia Paternal Grandmother   . Hypertension Paternal Grandmother    Social History   Tobacco Use  . Smoking status: Former Smoker    Quit date: 09/07/2006    Years since quitting: 12.6  . Smokeless tobacco: Former Network engineer Use Topics  . Alcohol use: Yes    Comment: occasionally  . Drug use: No      Review of Systems Per HPI    Objective:   Physical Exam Vitals signs reviewed.  Constitutional:      General: He is not in acute distress.    Appearance: Normal appearance. He is obese. He is not ill-appearing, toxic-appearing or diaphoretic.  HENT:     Head: Normocephalic and atraumatic.  Eyes:     Conjunctiva/sclera: Conjunctivae normal.  Cardiovascular:     Rate and Rhythm: Normal rate.  Pulmonary:     Effort: Pulmonary effort is normal.  Musculoskeletal:     Left foot: Tenderness and swelling present.     Comments: Mild swelling across base of 3-5 toes, no  erythema, no break in skin between toes. Slightly warm compared to right foot. Good strength, normal nail beds.   Skin:    General: Skin is warm and dry.  Neurological:     Mental Status: He is alert and oriented to person, place, and time.  Psychiatric:        Mood and Affect: Mood normal.        Behavior: Behavior normal.        Thought Content: Thought content normal.        Judgment: Judgment normal.       BP 132/90   Pulse 80   Temp 98.3 F (36.8 C)   Wt 244 lb (110.7 kg)   SpO2 98%   BMI 32.86 kg/m  Wt Readings from Last 3 Encounters:  05/17/19 244 lb (110.7 kg)  03/06/19 233 lb 7 oz (105.9 kg)  01/23/19 242 lb (109.8 kg)       Assessment & Plan:  1. Foot pain, left - recurrent, unable to identify any triggers, denies injury -  Patient Instructions  Good to see you today  I will notify you of lab results  Elevate, apply ice, continue ibuprofen 600 mg every 8-12 hours as needed   -  Uric Acid   Clarene Reamer, FNP-BC  [facility]  05/17/2019 11:17 AM

## 2019-05-17 NOTE — Telephone Encounter (Signed)
Spoke with pt suggesting OV to evaluate foot swelling.  Pt scheduled today at 10:15 with Debbie.

## 2019-05-17 NOTE — Patient Instructions (Signed)
Good to see you today  I will notify you of lab results  Elevate, apply ice, continue ibuprofen 600 mg every 8-12 hours as needed

## 2019-05-17 NOTE — Progress Notes (Signed)
Acute Office Visit  Subjective:    Patient ID: Andrew Phillips, male    DOB: 1976/02/11, 43 y.o.   MRN: [id number]  Chief Complaint  Patient presents with  . Foot Swelling    started yesterday-05/16/2019. Had same thing happened with right foot in August. pain present.     HPI Patient is in today for acute visit due to swollen left toes. Patient reported that he noticed the swelling and pain of three little toes yesterday morning. He had similar episode in August that got resolved spontaneously within 2 days. Pt states his pain 6/10. The pain wakes him up at night and he takes Ibuprofen 600-800 mg with moderate relief.  Patient denies any injury. Change in footwear or diet recently.   Past Medical History:  Diagnosis Date  . History of kidney stones   . HLD (hyperlipidemia)   . Hypertension   . NASH (nonalcoholic steatohepatitis)     Past Surgical History:  Procedure Laterality Date  . APPENDECTOMY    . VASECTOMY  2018  . WISDOM TOOTH EXTRACTION      Family History  Problem Relation Age of Onset  . Hypertension Mother   . Hypertension Father   . Hyperlipidemia Father   . Heart attack Maternal Grandfather 60       smoker  . Dementia Paternal Grandmother   . Hypertension Paternal Grandmother     Social History   Socioeconomic History  . Marital status: Married    Spouse name: Not on file  . Number of children: Not on file  . Years of education: Not on file  . Highest education level: Not on file  Occupational History  . Not on file  Social Needs  . Financial resource strain: Not on file  . Food insecurity    Worry: Not on file    Inability: Not on file  . Transportation needs    Medical: Not on file    Non-medical: Not on file  Tobacco Use  . Smoking status: Former Smoker    Quit date: 09/07/2006    Years since quitting: 12.6  . Smokeless tobacco: Former Network engineer and Sexual Activity  . Alcohol use: Yes    Comment: occasionally  . Drug use: No  .  Sexual activity: Yes  Lifestyle  . Physical activity    Days per week: Not on file    Minutes per session: Not on file  . Stress: Not on file  Relationships  . Social Herbalist on phone: Not on file    Gets together: Not on file    Attends religious service: Not on file    Active member of club or organization: Not on file    Attends meetings of clubs or organizations: Not on file    Relationship status: Not on file  . Intimate partner violence    Fear of current or ex partner: Not on file    Emotionally abused: Not on file    Physically abused: Not on file    Forced sexual activity: Not on file  Other Topics Concern  . 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 amLODipine (NORVASC) 10 MG tablet Take  1 tablet (10 mg total) by mouth daily. 90 tablet 3  . hydrochlorothiazide (HYDRODIURIL) 25 MG tablet Take 1 tablet (25 mg total) by mouth daily. 90 tablet 3   No facility-administered medications prior to visit.     Allergies  Allergen Reactions  . Lisinopril Swelling    Angioedema (throat swelling)    Review of Systems  Constitutional: Negative for chills, fever, malaise/fatigue and weight loss.  HENT: Negative for ear pain and sinus pain.   Eyes: Negative for pain.  Respiratory: Negative for cough.   Cardiovascular: Negative for chest pain and palpitations.  Gastrointestinal: Negative for abdominal pain, constipation, diarrhea, nausea and vomiting.  Genitourinary: Negative.   Musculoskeletal:       Swollen painful left toes  Skin: Negative.   Neurological: Negative for dizziness, tingling, sensory change, weakness and headaches.  Psychiatric/Behavioral: Negative.        Objective:    Physical Exam   Constitutional: He is oriented to person, place, and time. He appears well-developed and well-nourished.  HENT:  Head: Normocephalic and atraumatic.  Eyes: Pupils are equal, round, and reactive to light. Conjunctivae and EOM are normal.  Neck: Normal range of motion.  Cardiovascular: Normal rate, regular rhythm and normal heart sounds. Exam reveals no gallop and no friction rub.  No murmur heard. Pulmonary/Chest: Effort normal and breath sounds normal. No respiratory distress. He has no wheezes. He has no rales.  Abdominal: Soft. Bowel sounds are normal. There is no abdominal tenderness.  Musculoskeletal:        General: Tenderness and edema present.     Comments: Left toes  Neurological: He is alert and oriented to person, place, and time.  Skin: Skin is warm and dry. No rash noted. No erythema.  Psychiatric: He has a normal mood and affect. His behavior is normal. Thought content normal.  Nursing note and vitals reviewed.   BP 132/90   Pulse 80   Temp 98.3 F (36.8 C)   Wt 110.7 kg   SpO2 98%   BMI 32.86 kg/m  Wt Readings from Last 3 Encounters:  05/17/19 110.7 kg  03/06/19 105.9 kg  01/23/19 109.8 kg    Health Maintenance Due  Topic Date Due  . HIV Screening  09/13/1992  . INFLUENZA VACCINE  04/08/2019    There are no preventive care reminders to display for this patient.   Lab Results  Component Value Date   TSH 2.69 02/27/2019   Lab Results  Component Value Date   WBC 6.3 03/01/2018   HGB 14.4 03/01/2018   HCT 41.7 03/01/2018   MCV 85.5 03/01/2018   PLT 178.0 03/01/2018   Lab Results  Component Value Date   NA 136 02/27/2019   K 3.9 02/27/2019   CO2 28 02/27/2019   GLUCOSE 103 (H) 02/27/2019   BUN 20 02/27/2019   CREATININE 0.94 02/27/2019   BILITOT 0.9 02/27/2019   BILITOT 0.9 02/27/2019   ALKPHOS 57 02/27/2019   ALKPHOS 57 02/27/2019   AST 25 02/27/2019   AST 25 02/27/2019   ALT 40 02/27/2019   ALT 40 02/27/2019   PROT 7.5 02/27/2019    PROT 7.5 02/27/2019   ALBUMIN 5.2 02/27/2019   ALBUMIN 5.2 02/27/2019   CALCIUM 9.8 02/27/2019   ANIONGAP 9 04/03/2012   GFR 88.24 02/27/2019   Lab Results  Component Value Date   CHOL 180 02/27/2019   Lab Results  Component Value Date   HDL 34.90 (L) 02/27/2019   Lab Results  Component  Value Date   LDLCALC 119 (H) 02/27/2019   Lab Results  Component Value Date   TRIG 128.0 02/27/2019   Lab Results  Component Value Date   CHOLHDL 5 02/27/2019   No results found for: HGBA1C     Assessment & Plan:   1. Foot pain, left Swollen, painful and slightly warm to touch. Had similar episodes in the past but this one is more acute.  - Uric Acid  Problem List Items Addressed This Visit    None       No orders of the defined types were placed in this encounter.    Rica Koyanagi, RN

## 2019-06-01 MED FILL — AMLODIPINE BESYLATE 10 MG T: 10 | 60 days supply | Qty: 60 | Fill #1

## 2019-06-22 ENCOUNTER — Encounter: Payer: Self-pay | Admitting: Family Medicine

## 2019-06-26 MED ORDER — COLCHICINE 0.6 MG PO TABS: 0.6000 mg | ORAL_TABLET | Freq: Every day | ORAL | 0 refills | Status: DC | PRN

## 2019-06-26 MED FILL — COLCHICINE 0.6 MG TABS: 0.6 | 30 days supply | Qty: 30 | Fill #0

## 2019-09-09 ENCOUNTER — Ambulatory Visit (HOSPITAL_COMMUNITY)
Admission: EM | Admit: 2019-09-09 | Discharge: 2019-09-09 | Disposition: A | Discharge status: Home / Self Care | Payer: No Typology Code available for payment source | Attending: Family Medicine | Admitting: Family Medicine

## 2019-09-09 ENCOUNTER — Encounter (HOSPITAL_COMMUNITY): Payer: Self-pay | Admitting: Emergency Medicine

## 2019-09-09 ENCOUNTER — Other Ambulatory Visit: Payer: Self-pay

## 2019-09-09 DIAGNOSIS — Z20822 Contact with and (suspected) exposure to covid-19: Secondary | ICD-10-CM | POA: Diagnosis present

## 2019-09-09 NOTE — Discharge Instructions (Addendum)
If your Covid-19 test is positive, you will receive a phone call from [facility] regarding your results. Negative test results are not called. Both positive and negative results area always visible on MyChart. If you do not have a MyChart account, sign up instructions are in your discharge papers.

## 2019-09-09 NOTE — ED Triage Notes (Signed)
Headache started 09/07/2019.  Also sinus drainage started at that time.  Wife is covid positive, patient reports as 09/05/2019 was day her lab was reported

## 2019-09-09 NOTE — ED Notes (Signed)
Bed: UC10 Expected date:  Expected time:  Means of arrival:  Comments: 1:00 covid Appt

## 2019-09-09 NOTE — ED Provider Notes (Signed)
[facility]   [id number] 09/09/19 Arrival Time: 1217  ASSESSMENT & PLAN:  1. Close exposure to COVID-19 virus      COVID-19 testing sent. See work note for self-isolation instructions.  Follow-up Information    Ria Bush, MD.   Specialty: Family Medicine Why: As needed. Contact information: [place] [address] [phone]           Reviewed expectations re: course of current medical issues. Questions answered. Outlined signs and symptoms indicating need for more acute intervention. Patient verbalized understanding. After Visit Summary given.   SUBJECTIVE: History from: patient. Andrew Phillips is a 44 y.o. male who requests COVID-19 testing. Known COVID-19 contact: wife. Recent travel: none. Denies: runny nose, congestion, fever, cough, sore throat and difficulty breathing. With a mild headache. Normal PO intake without n/v/d.  ROS: As per HPI.   OBJECTIVE:  Vitals:   09/09/19 1255  BP: (!) 141/88  Pulse: 83  Resp: 20  Temp: 98.7 F (37.1 C)  TempSrc: Oral  SpO2: 98%    General appearance: alert; no distress Eyes: PERRLA; EOMI; conjunctiva normal HENT: [place]; AT; nasal mucosa normal; oral mucosa normal Neck: supple  Lungs: speaks full sentences without difficulty; unlabored Heart: regular rate and rhythm Abdomen: soft, non-tender Extremities: no edema Skin: warm and dry Neurologic: normal gait Psychological: alert and cooperative; normal mood and affect  Labs: Labs Reviewed  NOVEL CORONAVIRUS, NAA (HOSP ORDER, SEND-OUT TO REF LAB; TAT 18-24 HRS)    Allergies  Allergen Reactions  . Lisinopril Swelling    Angioedema (throat swelling)    Past Medical History:  Diagnosis Date  . History of kidney stones   . HLD (hyperlipidemia)   . Hypertension   . NASH (nonalcoholic steatohepatitis)    Social History   Socioeconomic History  . Marital status: Married    Spouse name: Not on file  . Number of  children: Not on file  . Years of education: Not on file  . Highest education level: Not on file  Occupational History  . Not on file  Tobacco Use  . Smoking status: Former Smoker    Quit date: 09/07/2006    Years since quitting: 13.0  . Smokeless tobacco: Former Network engineer and Sexual Activity  . Alcohol use: Yes    Comment: occasionally  . Drug use: No  . Sexual activity: Yes  Other Topics Concern  . 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 Difficulty of Paying Living Expenses: Not on file  Food Insecurity:   . Worried About Charity fundraiser in the Last Year: Not on file  . Ran Out of Food in the Last Year: Not on file  Transportation Needs:   . Lack of Transportation (Medical): Not on file  . Lack of Transportation (Non-Medical): Not on file  Physical Activity:   . Days of Exercise per Week: Not on file  . Minutes of Exercise per Session: Not on file  Stress:   . Feeling of Stress : Not on file  Social Connections:   . Frequency of Communication with Friends and Family: Not on file  . Frequency of Social Gatherings with Friends and Family: Not on  file  . Attends Religious Services: Not on file  . Active Member of Clubs or Organizations: Not on file  . Attends Archivist Meetings: Not on file  . Marital Status: Not on file  Intimate Partner Violence:   . Fear of Current or Ex-Partner: Not on file  . Emotionally Abused: Not on file  . Physically Abused: Not on file  . Sexually Abused: Not on file   Family History  Problem Relation Age of Onset  . Hypertension Mother   . Hypertension Father   . Hyperlipidemia Father   . Heart attack Maternal Grandfather 60       smoker  . Dementia Paternal Grandmother   .  Hypertension Paternal Grandmother    Past Surgical History:  Procedure Laterality Date  . APPENDECTOMY    . VASECTOMY  2018  . WISDOM TOOTH EXTRACTION       Vanessa Kick, MD 09/09/19 1314

## 2019-09-10 LAB — NOVEL CORONAVIRUS, NAA (HOSP ORDER, SEND-OUT TO REF LAB; TAT 18-24 HRS): SARS-CoV-2, NAA: NOT DETECTED

## 2019-10-24 ENCOUNTER — Encounter: Payer: Self-pay | Admitting: Family Medicine

## 2019-10-25 MED FILL — HYDROCHLOROTHIAZIDE 25 MG T: 25 | 90 days supply | Qty: 90 | Fill #0

## 2019-10-25 NOTE — Telephone Encounter (Signed)
Spoke with Manuela Schwartz at [facility] asking about pt's HCTZ rx.  Told it has been filled and is ready to pick up.  I relayed the message we received from the pt.  And she explained pt may have tried to refill from an old rx # which more than likely does not have anymore refills.    Notified pt, via MyChart, of above info.

## 2019-11-17 ENCOUNTER — Ambulatory Visit: Payer: No Typology Code available for payment source | Attending: Internal Medicine

## 2019-11-17 DIAGNOSIS — Z23 Encounter for immunization: Secondary | ICD-10-CM

## 2019-11-17 NOTE — Progress Notes (Signed)
   Covid-19 Vaccination Clinic  Name:  Andrew Phillips    MRN: [id number] DOB: Mar 25, 1976  11/17/2019  [name] was observed post Covid-19 immunization for 15 minutes without incident. He was provided with Vaccine Information Sheet and instruction to access the V-Safe system.   [name] was instructed to call 911 with any severe reactions post vaccine: Marland Kitchen Difficulty breathing  . Swelling of face and throat  . A fast heartbeat  . A bad rash all over body  . Dizziness and weakness   Immunizations Administered    Name Date Dose VIS Date Route   Pfizer COVID-19 Vaccine 11/17/2019 12:36 PM 0.3 mL 08/18/2019 Intramuscular   Manufacturer: [place]   Lot: [id number]   [place]: [id number]

## 2019-12-05 IMAGING — US US ABDOMEN LIMITED
1 series · 14 of 25 positions shown · non-contrast
Comparison: 04/03/2012

CLINICAL DATA: Fevers and elevated LFTs, NASH

EXAM:
ULTRASOUND ABDOMEN LIMITED RIGHT UPPER QUADRANT

[Series 1: us abdomen limited · 0.20mm/px · 14 of 47 slices shown]
[im 1/47]
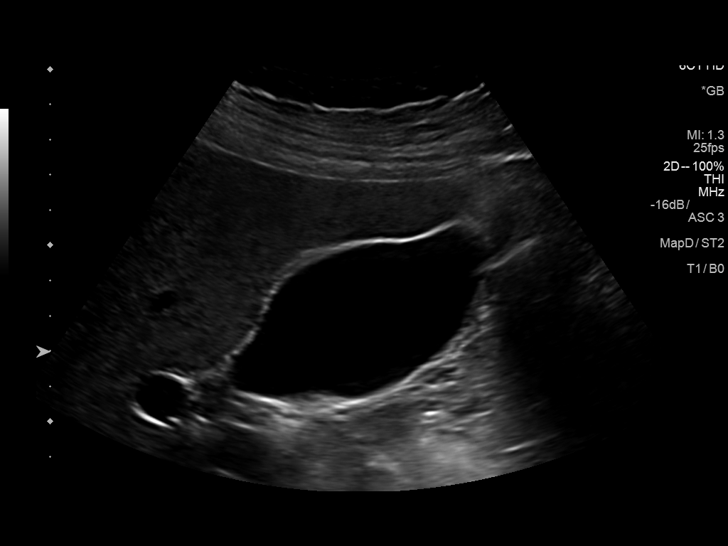
[im 4/47]
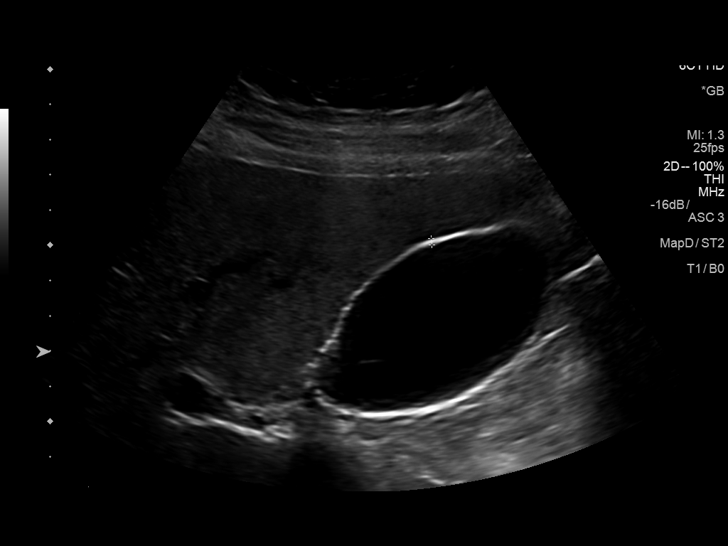
[im 8/47]
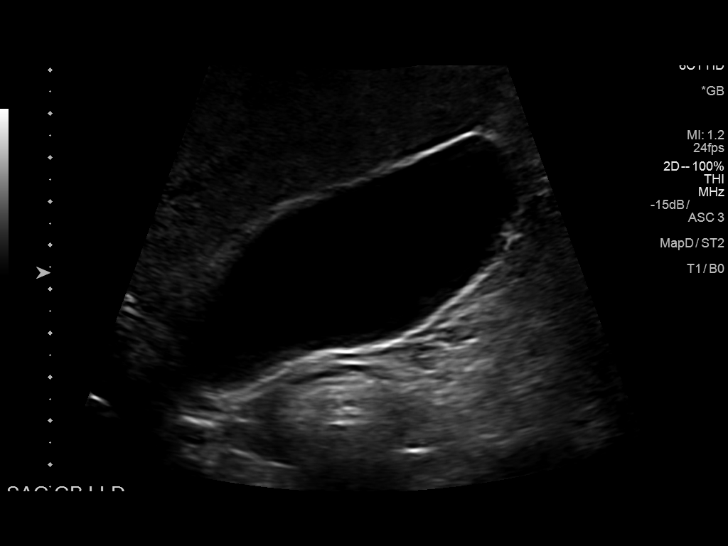
[im 12/47]
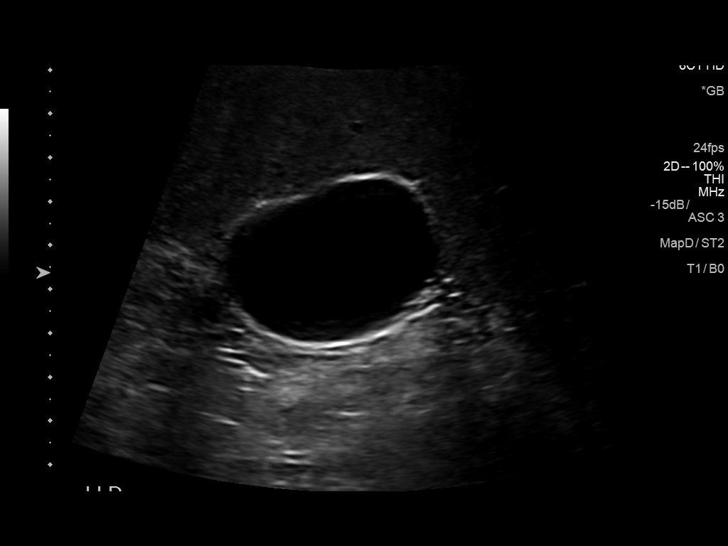
[im 16/47]
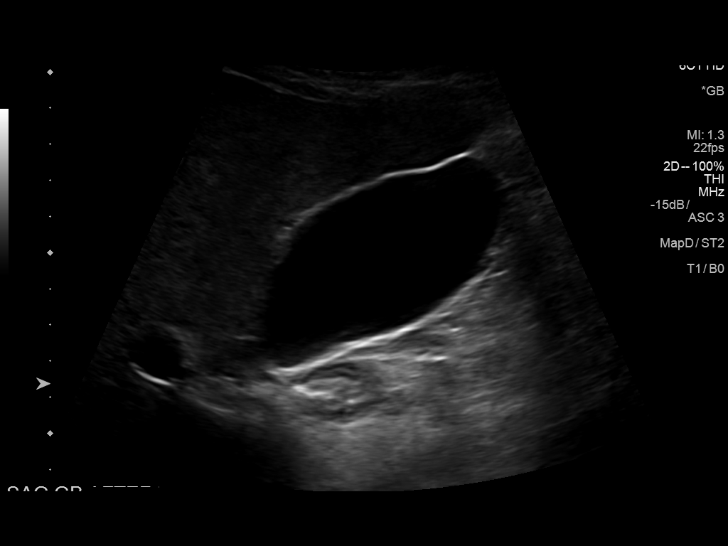
[im 18/47]
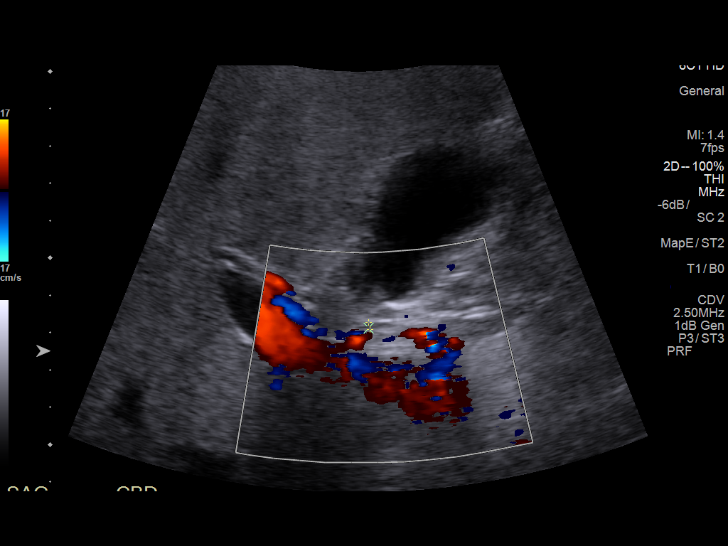
[im 22/47]
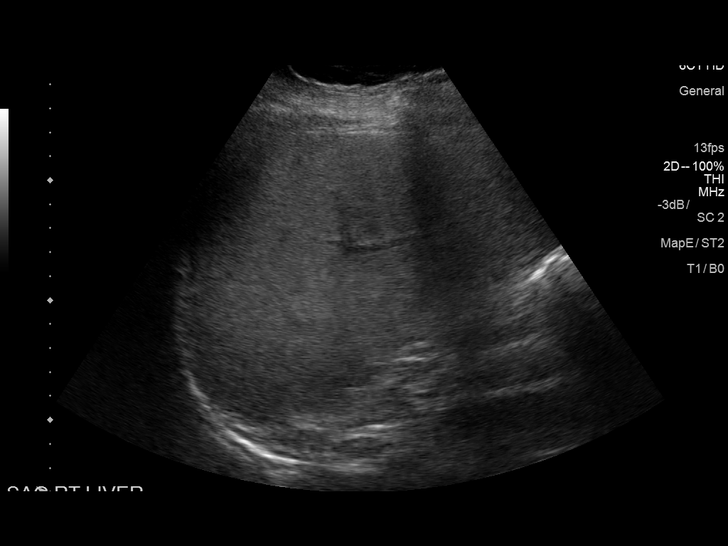
[im 25/47]
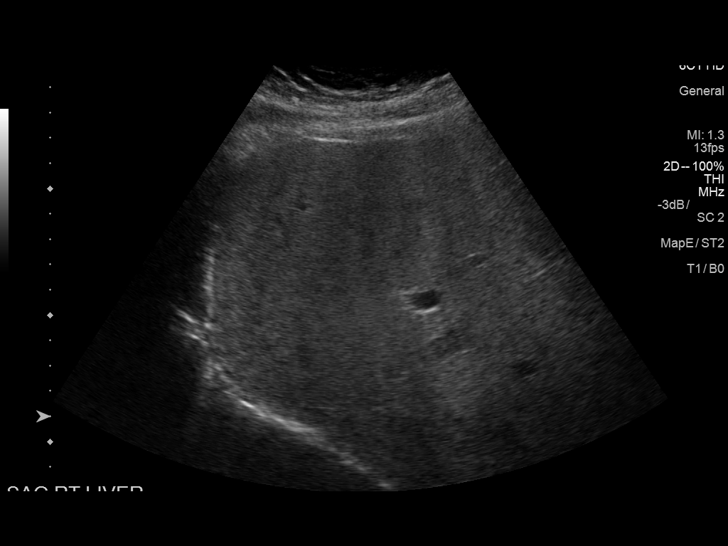
[im 29/47]
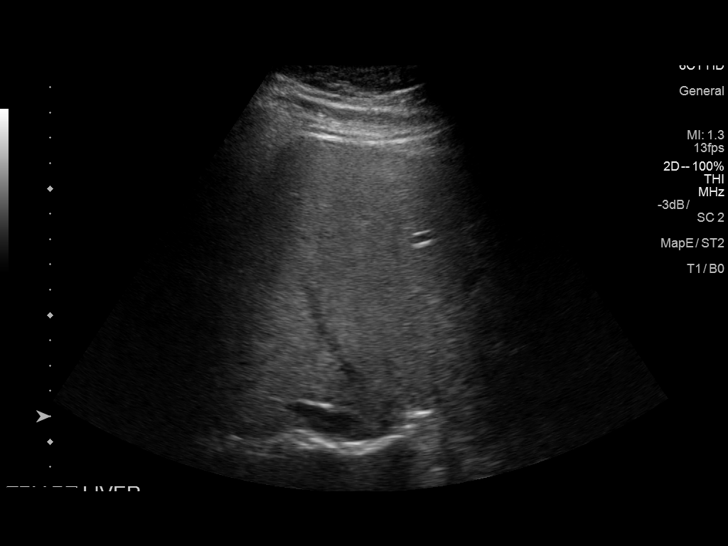
[im 31/47]
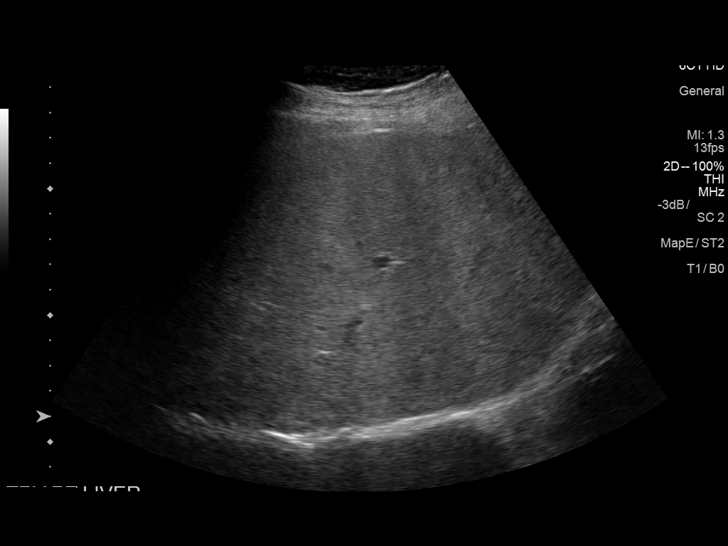
[im 35/47]
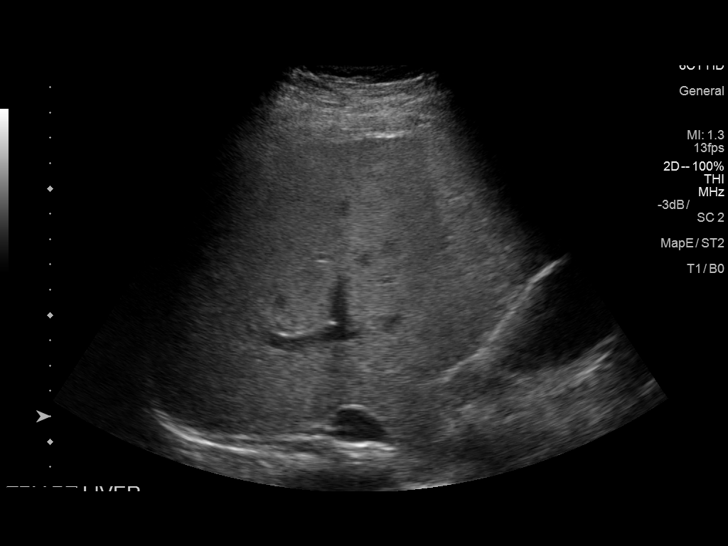
[im 39/47]
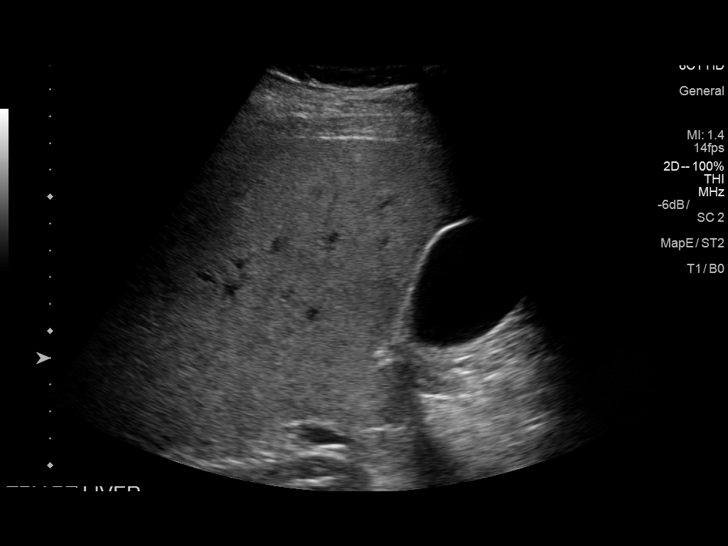
[im 43/47]
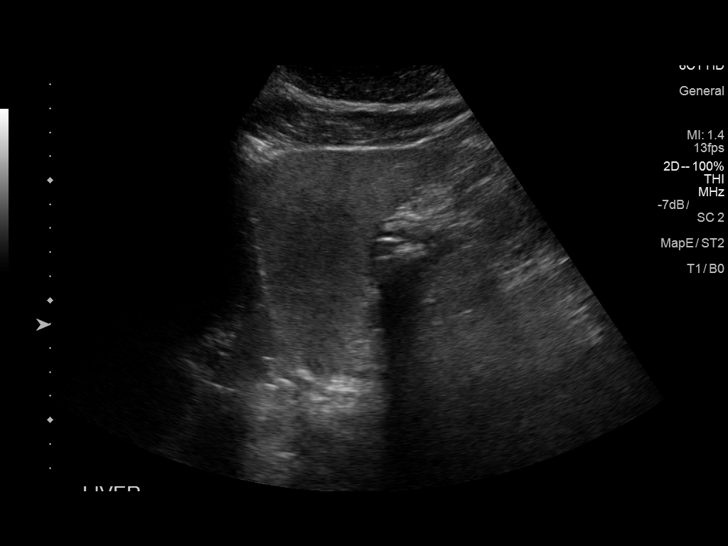
[im 47/47]
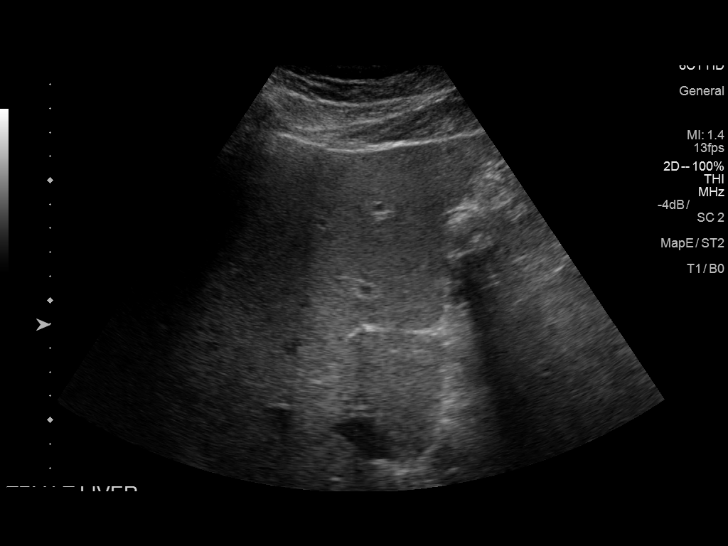

[14 of 25 positions shown; findings below may reference images not displayed]

FINDINGS: Gallbladder:

No gallstones or wall thickening visualized. No sonographic Murphy
sign noted by sonographer.

Common bile duct:

Diameter: 1.9 mm.

Liver:

Increased in echogenicity without focal mass lesion. These changes
are consistent with the given clinical history. Portal vein is
patent on color Doppler imaging with normal direction of blood flow
towards the liver.
IMPRESSION: Increased echogenicity within the liver consistent with the given
clinical history. No other focal abnormality is noted.

## 2019-12-11 ENCOUNTER — Ambulatory Visit: Payer: No Typology Code available for payment source | Attending: Internal Medicine

## 2019-12-11 DIAGNOSIS — Z23 Encounter for immunization: Secondary | ICD-10-CM

## 2019-12-11 NOTE — Progress Notes (Signed)
   Covid-19 Vaccination Clinic  Name:  Andrew Phillips    MRN: [id number] DOB: 1976-05-06  12/11/2019  Mr. Andrew Phillips was observed post Covid-19 immunization for 30 minutes based on pre-vaccination screening without incident. He was provided with Vaccine Information Sheet and instruction to access the V-Safe system.   Mr. Andrew Phillips was instructed to call 911 with any severe reactions post vaccine: Marland Kitchen Difficulty breathing  . Swelling of face and throat  . A fast heartbeat  . A bad rash all over body  . Dizziness and weakness   Immunizations Administered    Name Date Dose VIS Date Route   Pfizer COVID-19 Vaccine 12/11/2019  3:24 PM 0.3 mL 08/18/2019 Intramuscular   Manufacturer: [place]   Lot: [id number]   [place]: [address]-7

## 2020-01-26 ENCOUNTER — Other Ambulatory Visit: Payer: Self-pay

## 2020-01-26 ENCOUNTER — Ambulatory Visit: Payer: No Typology Code available for payment source | Admitting: Family Medicine

## 2020-01-26 ENCOUNTER — Encounter: Payer: Self-pay | Admitting: Family Medicine

## 2020-01-26 ENCOUNTER — Ambulatory Visit (INDEPENDENT_AMBULATORY_CARE_PROVIDER_SITE_OTHER): Payer: No Typology Code available for payment source | Admitting: Family Medicine

## 2020-01-26 ENCOUNTER — Other Ambulatory Visit (HOSPITAL_COMMUNITY): Payer: Self-pay | Admitting: Family Medicine

## 2020-01-26 VITALS — BP 148/100 | HR 83 | Temp 98.0°F | Ht 72.25 in | Wt 252.6 lb

## 2020-01-26 DIAGNOSIS — I10 Essential (primary) hypertension: Secondary | ICD-10-CM

## 2020-01-26 DIAGNOSIS — E669 Obesity, unspecified: Secondary | ICD-10-CM

## 2020-01-26 DIAGNOSIS — N529 Male erectile dysfunction, unspecified: Secondary | ICD-10-CM | POA: Insufficient documentation

## 2020-01-26 MED ORDER — SILDENAFIL CITRATE 100 MG PO TABS: 50.0000 mg | ORAL_TABLET | Freq: Every day | ORAL | 11 refills | Status: DC | PRN

## 2020-01-26 MED ORDER — METOPROLOL SUCCINATE ER 25 MG PO TB24: 25.0000 mg | ORAL_TABLET | Freq: Every evening | ORAL | 11 refills | Status: DC

## 2020-01-26 MED FILL — METOPROLOL SUCCINATE ER 25: 25 | 30 days supply | Qty: 30 | Fill #0

## 2020-01-26 MED FILL — SILDENAFIL CITRATE 100 MG T: 100 | 30 days supply | Qty: 6 | Fill #0

## 2020-01-26 NOTE — Assessment & Plan Note (Addendum)
Chronic, uncontrolled - will add toprol XL 35m nightly to his hctz 281mduring the day. Reassess at CPE next month.  Will need EKG next visit.

## 2020-01-26 NOTE — Assessment & Plan Note (Signed)
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 Will trial viagra.  History and physical exam has not disclosed any obvious treatable cause of this complaint. He is informed that Viagra is sometimes not covered by insurance. It is available on a fee-for-service cost basis, and is relatively expensive. Discussed option of generic sildenafil. He can start with 50 mg dose, and increase to 100 mg if necessary. The method of use 1 hour prior to anticipated intercourse is explained. He should not use any more than one tablet in a 24 hour period. Possible side effects have been reviewed.  The patient is not taking nitrates, and denies he has access to nitrates in any form at any time. I have counseled him that taking Viagra with nitrates of any form can be very dangerous.

## 2020-01-26 NOTE — Assessment & Plan Note (Signed)
Discussed noted weight gain.

## 2020-01-26 NOTE — Progress Notes (Signed)
This visit was conducted in person.  BP (!) 148/100 (BP Location: Right Arm, Patient Position: Sitting, Cuff Size: Large)   Pulse 83   Temp 98 F (36.7 C) (Temporal)   Ht 6' 0.25" (1.835 m)   Wt 252 lb 9 oz (114.6 kg)   SpO2 97%   BMI 34.02 kg/m    CC: ED Subjective:    Patient ID: Andrew Phillips, male    DOB: 1976-01-26, 44 y.o.   MRN: [id number]  HPI: Andrew Phillips is a 44 y.o. male presenting on 01/26/2020 for Erectile Dysfunction (C/o slow erection and staying time. )   HTN - on HCTZ 68m daily. Normally BP runs high in am (150/100), then drops to 130/90 during the day. Takes HCTZ in the mornings. Amlodipine caused foot swelling so he stopped this 06/2019. Lisinopril caused angioedema.   Ongoing weight gain noted.   ED - ongoing over the last few years, acutely worse the past 6 months. Both trouble obtaining and maintaining erection. No stressors. Good relationship. No am erections. No LAD or dysuria or urethral discharge. No chest pain, headache, dizziness.   Maternal grandfather died of MI (longtime smoker).      Relevant past medical, surgical, family and social history reviewed and updated as indicated. Interim medical history since our last visit reviewed. Allergies and medications reviewed and updated. Outpatient Medications Prior to Visit  Medication Sig Dispense Refill  . hydrochlorothiazide (HYDRODIURIL) 25 MG tablet Take 1 tablet (25 mg total) by mouth daily. 90 tablet 3  . amLODipine (NORVASC) 10 MG tablet Take 1 tablet (10 mg total) by mouth daily. 90 tablet 3  . colchicine 0.6 MG tablet Take 1 tablet (0.6 mg total) by mouth daily as needed (foot pain/swelling). 30 tablet 0   No facility-administered medications prior to visit.     Per HPI unless specifically indicated in ROS section below Review of Systems Objective:  BP (!) 148/100 (BP Location: Right Arm, Patient Position: Sitting, Cuff Size: Large)   Pulse 83   Temp 98 F (36.7 C) (Temporal)   Ht 6'  0.25" (1.835 m)   Wt 252 lb 9 oz (114.6 kg)   SpO2 97%   BMI 34.02 kg/m   Wt Readings from Last 3 Encounters:  01/26/20 252 lb 9 oz (114.6 kg)  05/17/19 244 lb (110.7 kg)  03/06/19 233 lb 7 oz (105.9 kg)      Physical Exam Vitals and nursing note reviewed.  Constitutional:      Appearance: Normal appearance. He is obese. He is not ill-appearing.  Cardiovascular:     Rate and Rhythm: Normal rate and regular rhythm.     Pulses: Normal pulses.     Heart sounds: Normal heart sounds. No murmur.  Pulmonary:     Effort: Pulmonary effort is normal. No respiratory distress.     Breath sounds: Normal breath sounds. No wheezing, rhonchi or rales.  Abdominal:     Hernia: There is no hernia in the left inguinal area or right inguinal area.  Genitourinary:    Penis: Normal and circumcised.      Testes: Normal.        Right: Mass, tenderness or swelling not present. Right testis is descended.        Left: Mass, tenderness or swelling not present. Left testis is descended.  Lymphadenopathy:     Lower Body: No right inguinal adenopathy. No left inguinal adenopathy.  Neurological:     Mental Status: He is alert.  Assessment & Plan:  This visit occurred during the SARS-CoV-2 public health emergency.  Safety protocols were in place, including screening questions prior to the visit, additional usage of staff PPE, and extensive cleaning of exam room while observing appropriate contact time as indicated for disinfecting solutions.   Problem List Items Addressed This Visit    Obesity, Class I, BMI 30-34.9    Discussed noted weight gain.       Essential hypertension    Chronic, uncontrolled - will add toprol XL 6m nightly to his hctz 239mduring the day. Reassess at CPE next month.  Will need EKG next visit.       Relevant Medications   metoprolol succinate (TOPROL-XL) 25 MG 24 hr tablet   sildenafil (VIAGRA) 100 MG tablet   ED (erectile dysfunction) of organic origin    Discussed  likely etiology of organic erectile dysfunction. Will trial viagra.  History and physical exam has not disclosed any obvious treatable cause of this complaint. He is informed that Viagra is sometimes not covered by insurance. It is available on a fee-for-service cost basis, and is relatively expensive. Discussed option of generic sildenafil. He can start with 50 mg dose, and increase to 100 mg if necessary. The method of use 1 hour prior to anticipated intercourse is explained. He should not use any more than one tablet in a 24 hour period. Possible side effects have been reviewed.  The patient is not taking nitrates, and denies he has access to nitrates in any form at any time. I have counseled him that taking Viagra with nitrates of any form can be very dangerous.           Meds ordered this encounter  Medications  . metoprolol succinate (TOPROL-XL) 25 MG 24 hr tablet    Sig: Take 1 tablet (25 mg total) by mouth at bedtime.    Dispense:  30 tablet    Refill:  11  . sildenafil (VIAGRA) 100 MG tablet    Sig: Take 0.5-1 tablets (50-100 mg total) by mouth daily as needed for erectile dysfunction.    Dispense:  6 tablet    Refill:  11   No orders of the defined types were placed in this encounter.   Patient Instructions  EKG today.  Trial viagra 50-10018m Let me know if you'd like to try generic sildenafil 18m39mBlood pressure is staying too high - start toprol XL 25mg25mnight time.  Update me with effect of both new medicines.    Follow up plan: Return in about 1 month (around 02/26/2020), or if symptoms worsen or fail to improve.  Andrew Phillips

## 2020-01-26 NOTE — Patient Instructions (Addendum)
EKG today.  Trial viagra 50-134m.  Let me know if you'd like to try generic sildenafil 241m  Blood pressure is staying too high - start toprol XL 2544mt night time.  Update me with effect of both new medicines.

## 2020-01-27 NOTE — Telephone Encounter (Signed)
Forgot to get EKG yesterday. plz call pt - would offer he come in at his convenience for this or we can get at his upcoming physical.

## 2020-02-07 ENCOUNTER — Encounter: Payer: Self-pay | Admitting: Family Medicine

## 2020-02-07 MED ORDER — COLCHICINE 0.6 MG PO TABS: 0.6000 mg | ORAL_TABLET | Freq: Every day | ORAL | 3 refills | Status: DC | PRN

## 2020-02-07 MED FILL — COLCHICINE 0.6 MG TABS: 0.6 | 28 days supply | Qty: 30 | Fill #0

## 2020-02-15 ENCOUNTER — Encounter: Payer: Self-pay | Admitting: Family Medicine

## 2020-02-15 ENCOUNTER — Other Ambulatory Visit (HOSPITAL_COMMUNITY): Payer: Self-pay | Admitting: Family Medicine

## 2020-02-15 ENCOUNTER — Other Ambulatory Visit: Payer: Self-pay

## 2020-02-15 ENCOUNTER — Ambulatory Visit (INDEPENDENT_AMBULATORY_CARE_PROVIDER_SITE_OTHER): Payer: No Typology Code available for payment source | Admitting: Family Medicine

## 2020-02-15 VITALS — BP 148/98 | HR 73 | Temp 97.6°F | Ht 72.25 in | Wt 256.7 lb

## 2020-02-15 DIAGNOSIS — M1A0720 Idiopathic chronic gout, left ankle and foot, without tophus (tophi): Secondary | ICD-10-CM

## 2020-02-15 DIAGNOSIS — E782 Mixed hyperlipidemia: Secondary | ICD-10-CM | POA: Diagnosis not present

## 2020-02-15 DIAGNOSIS — N529 Male erectile dysfunction, unspecified: Secondary | ICD-10-CM

## 2020-02-15 DIAGNOSIS — I10 Essential (primary) hypertension: Secondary | ICD-10-CM

## 2020-02-15 DIAGNOSIS — Z0000 Encounter for general adult medical examination without abnormal findings: Secondary | ICD-10-CM | POA: Diagnosis not present

## 2020-02-15 DIAGNOSIS — K7581 Nonalcoholic steatohepatitis (NASH): Secondary | ICD-10-CM

## 2020-02-15 DIAGNOSIS — E669 Obesity, unspecified: Secondary | ICD-10-CM

## 2020-02-15 LAB — COMPREHENSIVE METABOLIC PANEL
ALT: 95 U/L — ABNORMAL HIGH (ref 0–53)
AST: 48 U/L — ABNORMAL HIGH (ref 0–37)
Albumin: 4.8 g/dL (ref 3.5–5.2)
Alkaline Phosphatase: 62 U/L (ref 39–117)
BUN: 14 mg/dL (ref 6–23)
CO2: 28 mEq/L (ref 19–32)
Calcium: 9.5 mg/dL (ref 8.4–10.5)
Chloride: 101 mEq/L (ref 96–112)
Creatinine, Ser: 0.86 mg/dL (ref 0.40–1.50)
GFR: 97.33 mL/min (ref >60.00)
Glucose, Bld: 109 mg/dL — ABNORMAL HIGH (ref 70–99)
Potassium: 3.8 mEq/L (ref 3.5–5.1)
Sodium: 138 mEq/L (ref 135–145)
Total Bilirubin: 0.8 mg/dL (ref 0.2–1.2)
Total Protein: 7.4 g/dL (ref 6.0–8.3)

## 2020-02-15 LAB — URIC ACID: Uric Acid, Serum: 9.5 mg/dL — ABNORMAL HIGH (ref 4.0–7.8)

## 2020-02-15 LAB — LIPID PANEL
Cholesterol: 181 mg/dL (ref 0–200)
HDL: 31.8 mg/dL — ABNORMAL LOW (ref >39.00)
LDL Cholesterol: 110 mg/dL — ABNORMAL HIGH (ref 0–99)
NonHDL: 148.8
Total CHOL/HDL Ratio: 6
Triglycerides: 192 mg/dL — ABNORMAL HIGH (ref 0.0–149.0)
VLDL: 38.4 mg/dL (ref 0.0–40.0)

## 2020-02-15 LAB — TSH: TSH: 0.95 u[IU]/mL (ref 0.35–4.50)

## 2020-02-15 LAB — MICROALBUMIN / CREATININE URINE RATIO
Creatinine,U: 91.5 mg/dL
Microalb Creat Ratio: 3.1 mg/g (ref 0.0–30.0)
Microalb, Ur: 2.9 mg/dL — ABNORMAL HIGH (ref 0.0–1.9)

## 2020-02-15 MED ORDER — METOPROLOL SUCCINATE ER 25 MG PO TB24: 25.0000 mg | ORAL_TABLET | Freq: Every evening | ORAL | 3 refills | Status: DC

## 2020-02-15 MED ORDER — AMLODIPINE BESYLATE 10 MG PO TABS: 10.0000 mg | ORAL_TABLET | Freq: Every day | ORAL | 3 refills | Status: DC

## 2020-02-15 NOTE — Assessment & Plan Note (Signed)
Reviewed diagnosis, low purine diet handout provided. Will stop HCTZ, start amlodipine in its place. Continue colchicine PRN.

## 2020-02-15 NOTE — Progress Notes (Addendum)
This visit was conducted in person.  BP (!) 148/98 (BP Location: Left Arm, Patient Position: Sitting, Cuff Size: Normal)   Pulse 73   Temp 97.6 F (36.4 C) (Temporal)   Ht 6' 0.25" (1.835 m)   Wt 256 lb 11.2 oz (116.4 kg)   SpO2 98%   BMI 34.57 kg/m    CC: CPE Subjective:    Patient ID: Andrew Phillips, male    DOB: Jan 28, 1976, 44 y.o.   MRN: [id number]  HPI: Andrew Phillips is a 44 y.o. male presenting on 02/15/2020 for Annual Exam (no new concerns)   Weight gain noted.  Recent left foot pain dorsal midfoot - likely gout - treated with colchicine with benefit. Has had several foot pain flares over the past year.  Upcoming trip to [facility] for several weeks.  Viagra helping ED.   Preventative: Flu shot - declines COVID vaccine - completed [place] 12/2019 Tdap 2012 Seat belt use discussed  Sunscreen use discussed, no changing moles on skin.  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 Interim medical history since our last visit reviewed. Allergies and medications reviewed and updated. Outpatient Medications Prior to Visit  Medication Sig Dispense Refill  . colchicine 0.6 MG tablet Take 1 tablet (0.6 mg total) by mouth daily as needed (gout flare). Take 2 tablets on first day of gout flare 30 tablet 3  . sildenafil (VIAGRA) 100 MG tablet Take 0.5-1 tablets (50-100 mg total) by mouth daily as needed for erectile dysfunction. 6 tablet 11  . hydrochlorothiazide (HYDRODIURIL) 25 MG tablet Take 1 tablet (25 mg total) by mouth daily. 90 tablet 3  . metoprolol succinate (TOPROL-XL) 25 MG 24 hr tablet Take 1 tablet (25 mg total) by mouth at bedtime. 30 tablet 11    No facility-administered medications prior to visit.     Per HPI unless specifically indicated in ROS section below Review of Systems  Constitutional: Negative for activity change, appetite change, chills, fatigue, fever and unexpected weight change.  HENT: Negative for hearing loss.   Eyes: Negative for visual disturbance.  Respiratory: Negative for cough, chest tightness, shortness of breath and wheezing.   Cardiovascular: Negative for chest pain, palpitations and leg swelling.  Gastrointestinal: Negative for abdominal distention, abdominal pain, blood in stool, constipation, diarrhea, nausea and vomiting.  Genitourinary: Negative for difficulty urinating and hematuria.  Musculoskeletal: Negative for arthralgias, myalgias and neck pain.  Skin: Negative for rash.  Neurological: Negative for dizziness, seizures, syncope and headaches.  Hematological: Negative for adenopathy. Does not bruise/bleed easily.  Psychiatric/Behavioral: Negative for dysphoric mood. The patient is not nervous/anxious.    Objective:  BP (!) 148/98 (BP Location: Left Arm, Patient Position: Sitting, Cuff Size: Normal)   Pulse 73   Temp 97.6 F (36.4 C) (Temporal)   Ht 6' 0.25" (1.835 m)   Wt 256 lb 11.2 oz (116.4 kg)   SpO2 98%   BMI 34.57 kg/m   Wt Readings from Last 3 Encounters:  02/15/20 256 lb 11.2 oz (116.4 kg)  01/26/20 252 lb 9 oz (114.6 kg)  05/17/19 244 lb (110.7 kg)      Physical  Exam Vitals and nursing note reviewed.  Constitutional:      General: He is not in acute distress.    Appearance: Normal appearance. He is well-developed. He is not ill-appearing.  HENT:     Head: Normocephalic and atraumatic.     Right Ear: Hearing, tympanic membrane, ear canal and external ear normal.     Left Ear: Hearing, tympanic membrane, ear canal and external ear normal.  Eyes:     General: No scleral icterus.    Extraocular Movements: Extraocular movements intact.     Conjunctiva/sclera: Conjunctivae  normal.     Pupils: Pupils are equal, round, and reactive to light.  Cardiovascular:     Rate and Rhythm: Normal rate and regular rhythm.     Pulses: Normal pulses.          Radial pulses are 2+ on the right side and 2+ on the left side.     Heart sounds: Normal heart sounds. No murmur heard.   Pulmonary:     Effort: Pulmonary effort is normal. No respiratory distress.     Breath sounds: Normal breath sounds. No wheezing, rhonchi or rales.  Abdominal:     General: Abdomen is flat. Bowel sounds are normal. There is no distension.     Palpations: Abdomen is soft. There is no mass.     Tenderness: There is no abdominal tenderness. There is no guarding or rebound.     Hernia: No hernia is present.  Musculoskeletal:        General: Normal range of motion.     Cervical back: Normal range of motion and neck supple.     Right lower leg: No edema.     Left lower leg: No edema.  Lymphadenopathy:     Cervical: No cervical adenopathy.  Skin:    General: Skin is warm and dry.     Findings: No rash.  Neurological:     General: No focal deficit present.     Mental Status: He is alert and oriented to person, place, and time.     Comments: CN grossly intact, station and gait intact  Psychiatric:        Mood and Affect: Mood normal.        Behavior: Behavior normal.        Thought Content: Thought content normal.        Judgment: Judgment normal.       Results for orders placed or performed during the hospital encounter of 09/09/19  Novel Coronavirus, NAA (Hosp order, Send-out to Ref Lab; TAT 18-24 hrs   Specimen: Nasopharyngeal Swab; Respiratory  Result Value Ref Range   SARS-CoV-2, NAA NOT DETECTED NOT DETECTED   Coronavirus Source NASOPHARYNGEAL    EKG - NSR rate 60s, normal axis, intervals, no acute ST/T changes, p mitrale Assessment & Plan:  This visit occurred during the SARS-CoV-2 public health emergency.  Safety protocols were in place, including screening questions prior to the  visit, additional usage of staff PPE, and extensive cleaning of exam room while observing appropriate contact time as indicated for disinfecting solutions.   Problem List Items Addressed This Visit    Obesity, Class I, BMI 30-34.9    Encouraged healthy diet and lifestyle changes to affect sustainable weight loss.       NASH (nonalcoholic steatohepatitis)    Update LFT with weight gain noted.       Idiopathic chronic gout of left foot without tophus    Reviewed diagnosis, low purine diet  handout provided. Will stop HCTZ, start amlodipine in its place. Continue colchicine PRN.       Relevant Orders   Uric acid   HLD (hyperlipidemia)    Update labs today.       Relevant Medications   amLODipine (NORVASC) 10 MG tablet   metoprolol succinate (TOPROL-XL) 25 MG 24 hr tablet   Other Relevant Orders   Lipid panel   Comprehensive metabolic panel   TSH   Health maintenance examination - Primary    Preventative protocols reviewed and updated unless pt declined. Discussed healthy diet and lifestyle.       Essential hypertension    Chronic, uncontrolled. Will change hctz to amlodipine (retrial) given new dx gout. Advised to monitor BP at home and let me know if consistently >140/90.  Baseline EKG today - monitor for LAE.       Relevant Medications   amLODipine (NORVASC) 10 MG tablet   metoprolol succinate (TOPROL-XL) 25 MG 24 hr tablet   Other Relevant Orders   TSH   Microalbumin / creatinine urine ratio   EKG 12-Lead (Completed)   ED (erectile dysfunction) of organic origin    Using viagra with benefit.           Meds ordered this encounter  Medications  . amLODipine (NORVASC) 10 MG tablet    Sig: Take 1 tablet (10 mg total) by mouth daily.    Dispense:  90 tablet    Refill:  3    To replace HCTZ  . metoprolol succinate (TOPROL-XL) 25 MG 24 hr tablet    Sig: Take 1 tablet (25 mg total) by mouth at bedtime.    Dispense:  90 tablet    Refill:  3   Orders Placed This  Encounter  Procedures  . Lipid panel  . Comprehensive metabolic panel  . Uric acid  . TSH  . Microalbumin / creatinine urine ratio  . EKG 12-Lead    Patient instructions: Labs today.  EKG today. Stop hydrochlorothiazide which could be contributing to gout flares.  Start amlodipine 79m daily in addition to toprol XL.  Check BP at home to ensure <140/90. Let me know if otherwise.  Good to see you today, return in 6 months for follow up visit.   Follow up plan: Return in about 6 months (around 08/16/2020) for follow up visit.  JRia Bush MD

## 2020-02-15 NOTE — Assessment & Plan Note (Signed)
Using viagra with benefit.

## 2020-02-15 NOTE — Assessment & Plan Note (Signed)
Update labs today.

## 2020-02-15 NOTE — Patient Instructions (Addendum)
Labs today.  EKG today. Stop hydrochlorothiazide which could be contributing to gout flares.  Start amlodipine 40m daily in addition to toprol XL.  Check BP at home to ensure <140/90. Let me know if otherwise.  Good to see you today, return in 6 months for follow up visit.   Low-Purine Eating Plan A low-purine eating plan involves making food choices to limit your intake of purine. Purine is a kind of uric acid. Too much uric acid in your blood can cause certain conditions, such as gout and kidney stones. Eating a low-purine diet can help control these conditions. What are tips for following this plan? Reading food labels   Avoid foods with saturated or Trans fat.  Check the ingredient list of grains-based foods, such as bread and cereal, to make sure that they contain whole grains.  Check the ingredient list of sauces or soups to make sure they do not contain meat or fish.  When choosing soft drinks, check the ingredient list to make sure they do not contain high-fructose corn syrup. Shopping  Buy plenty of fresh fruits and vegetables.  Avoid buying canned or fresh fish.  Buy dairy products labeled as low-fat or nonfat.  Avoid buying premade or processed foods. These foods are often high in fat, salt (sodium), and added sugar. Cooking  Use olive oil instead of butter when cooking. Oils like olive oil, canola oil, and sunflower oil contain healthy fats. Meal planning  Learn which foods do or do not affect you. If you find out that a food tends to cause your gout symptoms to flare up, avoid eating that food. You can enjoy foods that do not cause problems. If you have any questions about a food item, talk with your dietitian or health care provider.  Limit foods high in fat, especially saturated fat. Fat makes it harder for your body to get rid of uric acid.  Choose foods that are lower in fat and are lean sources of protein. General guidelines  Limit alcohol intake to no more  than 1 drink a day for nonpregnant women and 2 drinks a day for men. One drink equals 12 oz of beer, 5 oz of wine, or 1 oz of hard liquor. Alcohol can affect the way your body gets rid of uric acid.  Drink plenty of water to keep your urine clear or pale yellow. Fluids can help remove uric acid from your body.  If directed by your health care provider, take a vitamin C supplement.  Work with your health care provider and dietitian to develop a plan to achieve or maintain a healthy weight. Losing weight can help reduce uric acid in your blood. What foods are recommended? The items listed may not be a complete list. Talk with your dietitian about what dietary choices are best for you. Foods low in purines Foods low in purines do not need to be limited. These include:  All fruits.  All low-purine vegetables, pickles, and olives.  Breads, pasta, rice, cornbread, and popcorn. Cake and other baked goods.  All dairy foods.  Eggs, nuts, and nut butters.  Spices and condiments, such as salt, herbs, and vinegar.  Plant oils, butter, and margarine.  Water, sugar-free soft drinks, tea, coffee, and cocoa.  Vegetable-based soups, broths, sauces, and gravies. Foods moderate in purines Foods moderate in purines should be limited to the amounts listed.   cup of asparagus, cauliflower, spinach, mushrooms, or green peas, each day.  2/3 cup uncooked oatmeal, each day.  cup dry wheat bran or wheat germ, each day.  2-3 ounces of meat or poultry, each day.  4-6 ounces of shellfish, such as crab, lobster, oysters, or shrimp, each day.  1 cup cooked beans, peas, or lentils, each day.  Soup, broths, or bouillon made from meat or fish. Limit these foods as much as possible. What foods are not recommended? The items listed may not be a complete list. Talk with your dietitian about what dietary choices are best for you. Limit your intake of foods high in purines, including:  Beer and other  alcohol.  Meat-based gravy or sauce.  Canned or fresh fish, such as: ? Anchovies, sardines, herring, and tuna. ? Mussels and scallops. ? Codfish, trout, and haddock.  Berniece Salines.  Organ meats, such as: ? Liver or kidney. ? Tripe. ? Sweetbreads (thymus gland or pancreas).  Wild Clinical biochemist.  Yeast or yeast extract supplements.  Drinks sweetened with high-fructose corn syrup. Summary  Eating a low-purine diet can help control conditions caused by too much uric acid in the body, such as gout or kidney stones.  Choose low-purine foods, limit alcohol, and limit foods high in fat.  You will learn over time which foods do or do not affect you. If you find out that a food tends to cause your gout symptoms to flare up, avoid eating that food. This information is not intended to replace advice given to you by your health care provider. Make sure you discuss any questions you have with your health care provider. Document Revised: 08/06/2017 Document Reviewed: 10/07/2016 Elsevier Patient Education  2020 [place] Maintenance, Male Adopting a healthy lifestyle and getting preventive care are important in promoting health and wellness. Ask your health care provider about:  The right schedule for you to have regular tests and exams.  Things you can do on your own to prevent diseases and keep yourself healthy. What should I know about diet, weight, and exercise? Eat a healthy diet   Eat a diet that includes plenty of vegetables, fruits, low-fat dairy products, and lean protein.  Do not eat a lot of foods that are high in solid fats, added sugars, or sodium. Maintain a healthy weight Body mass index (BMI) is a measurement that can be used to identify possible weight problems. It estimates body fat based on height and weight. Your health care provider can help determine your BMI and help you achieve or maintain a healthy weight. Get regular exercise Get regular exercise. This  is one of the most important things you can do for your health. Most adults should:  Exercise for at least 150 minutes each week. The exercise should increase your heart rate and make you sweat (moderate-intensity exercise).  Do strengthening exercises at least twice a week. This is in addition to the moderate-intensity exercise.  Spend less time sitting. Even light physical activity can be beneficial. Watch cholesterol and blood lipids Have your blood tested for lipids and cholesterol at 42 years of age, then have this test every 5 years. You may need to have your cholesterol levels checked more often if:  Your lipid or cholesterol levels are high.  You are older than 42 years of age.  You are at high risk for heart disease. What should I know about cancer screening? Many types of cancers can be detected early and may often be prevented. Depending on your health history and family history, you may need to have cancer screening at various ages.  This may include screening for:  Colorectal cancer.  Prostate cancer.  Skin cancer.  Lung cancer. What should I know about heart disease, diabetes, and high blood pressure? Blood pressure and heart disease  High blood pressure causes heart disease and increases the risk of stroke. This is more likely to develop in people who have high blood pressure readings, are of African descent, or are overweight.  Talk with your health care provider about your target blood pressure readings.  Have your blood pressure checked: ? Every 3-5 years if you are 63-75 years of age. ? Every year if you are 34 years old or older.  If you are between the ages of 62 and 52 and are a current or former smoker, ask your health care provider if you should have a one-time screening for abdominal aortic aneurysm (AAA). Diabetes Have regular diabetes screenings. This checks your fasting blood sugar level. Have the screening done:  Once every three years after age 52  if you are at a normal weight and have a low risk for diabetes.  More often and at a younger age if you are overweight or have a high risk for diabetes. What should I know about preventing infection? Hepatitis B If you have a higher risk for hepatitis B, you should be screened for this virus. Talk with your health care provider to find out if you are at risk for hepatitis B infection. Hepatitis C Blood testing is recommended for:  Everyone born from [birth year] through 1963.  Anyone with known risk factors for hepatitis C. Sexually transmitted infections (STIs)  You should be screened each year for STIs, including gonorrhea and chlamydia, if: ? You are sexually active and are younger than 42 years of age. ? You are older than 42 years of age and your health care provider tells you that you are at risk for this type of infection. ? Your sexual activity has changed since you were last screened, and you are at increased risk for chlamydia or gonorrhea. Ask your health care provider if you are at risk.  Ask your health care provider about whether you are at high risk for HIV. Your health care provider may recommend a prescription medicine to help prevent HIV infection. If you choose to take medicine to prevent HIV, you should first get tested for HIV. You should then be tested every 3 months for as long as you are taking the medicine. Follow these instructions at home: Lifestyle  Do not use any products that contain nicotine or tobacco, such as cigarettes, e-cigarettes, and chewing tobacco. If you need help quitting, ask your health care provider.  Do not use street drugs.  Do not share needles.  Ask your health care provider for help if you need support or information about quitting drugs. Alcohol use  Do not drink alcohol if your health care provider tells you not to drink.  If you drink alcohol: ? Limit how much you have to 0-2 drinks a day. ? Be aware of how much alcohol is in your  drink. In the [place] one drink equals one 12 oz bottle of beer (355 mL), one 5 oz glass of wine (148 mL), or one 1 oz glass of hard liquor (44 mL). General instructions  Schedule regular health, dental, and eye exams.  Stay current with your vaccines.  Tell your health care provider if: ? You often feel depressed. ? You have ever been abused or do not feel safe at home. Summary  Adopting a healthy lifestyle and getting preventive care are important in promoting health and wellness.  Follow your health care provider's instructions about healthy diet, exercising, and getting tested or screened for diseases.  Follow your health care provider's instructions on monitoring your cholesterol and blood pressure. This information is not intended to replace advice given to you by your health care provider. Make sure you discuss any questions you have with your health care provider. Document Revised: 08/17/2018 Document Reviewed: 08/17/2018 Elsevier Patient Education  2020 [employer].

## 2020-02-15 NOTE — Assessment & Plan Note (Signed)
Encouraged healthy diet and lifestyle changes to affect sustainable weight loss.  

## 2020-02-15 NOTE — Assessment & Plan Note (Signed)
Update LFT with weight gain noted.

## 2020-02-15 NOTE — Assessment & Plan Note (Addendum)
Chronic, uncontrolled. Will change hctz to amlodipine (retrial) given new dx gout. Advised to monitor BP at home and let me know if consistently >140/90.  Baseline EKG today - monitor for LAE.

## 2020-02-15 NOTE — Assessment & Plan Note (Signed)
Preventative protocols reviewed and updated unless pt declined. Discussed healthy diet and lifestyle.  

## 2020-02-19 MED FILL — SILDENAFIL CITRATE 100 MG T: 100 | 30 days supply | Qty: 6 | Fill #1

## 2020-02-19 MED FILL — METOPROLOL SUCCINATE ER 25: 25 | 30 days supply | Qty: 30 | Fill #1

## 2020-03-27 MED FILL — METOPROLOL SUCCINATE ER 25: 25 | 30 days supply | Qty: 30 | Fill #2

## 2020-03-27 MED FILL — SILDENAFIL CITRATE 100 MG T: 100 | 30 days supply | Qty: 6 | Fill #2

## 2020-03-27 MED FILL — COLCHICINE 0.6 MG TABS: 0.6 | 28 days supply | Qty: 30 | Fill #1

## 2020-04-29 MED FILL — METOPROLOL SUCCINATE ER 25: 25 | 30 days supply | Qty: 30 | Fill #3

## 2020-05-27 MED FILL — AMLODIPINE BESYLATE 10 MG T: 10 | 90 days supply | Qty: 90 | Fill #1

## 2020-06-07 MED FILL — METOPROLOL SUCCINATE ER 25: 25 | 30 days supply | Qty: 30 | Fill #4

## 2020-08-09 MED FILL — METOPROLOL SUCCINATE ER 25: 25 | 30 days supply | Qty: 30 | Fill #5

## 2020-08-19 ENCOUNTER — Ambulatory Visit: Payer: No Typology Code available for payment source | Admitting: Family Medicine

## 2020-08-19 DIAGNOSIS — Z0289 Encounter for other administrative examinations: Secondary | ICD-10-CM

## 2020-09-16 MED FILL — SILDENAFIL CITRATE 100 MG T: 100 | 30 days supply | Qty: 6 | Fill #3

## 2020-09-16 MED FILL — AMLODIPINE BESYLATE 10 MG T: 10 | 90 days supply | Qty: 90 | Fill #2

## 2020-09-16 MED FILL — METOPROLOL SUCCINATE ER 25: 25 | 30 days supply | Qty: 30 | Fill #6

## 2020-11-05 MED FILL — METOPROLOL SUCCINATE ER 25: 25 | 30 days supply | Qty: 30 | Fill #7

## 2020-11-07 MED FILL — SILDENAFIL CITRATE 100 MG T: 100 | 30 days supply | Qty: 6 | Fill #4

## 2020-11-19 ENCOUNTER — Ambulatory Visit (INDEPENDENT_AMBULATORY_CARE_PROVIDER_SITE_OTHER): Payer: 59 | Admitting: Family Medicine

## 2020-11-19 ENCOUNTER — Other Ambulatory Visit: Payer: Self-pay

## 2020-11-19 ENCOUNTER — Encounter: Payer: Self-pay | Admitting: Family Medicine

## 2020-11-19 VITALS — BP 130/82 | HR 68 | Temp 97.6°F | Ht 73.25 in | Wt 251.5 lb

## 2020-11-19 DIAGNOSIS — M1A0720 Idiopathic chronic gout, left ankle and foot, without tophus (tophi): Secondary | ICD-10-CM | POA: Diagnosis not present

## 2020-11-19 DIAGNOSIS — E669 Obesity, unspecified: Secondary | ICD-10-CM | POA: Diagnosis not present

## 2020-11-19 DIAGNOSIS — I10 Essential (primary) hypertension: Secondary | ICD-10-CM

## 2020-11-19 DIAGNOSIS — N529 Male erectile dysfunction, unspecified: Secondary | ICD-10-CM

## 2020-11-19 MED ORDER — METOPROLOL SUCCINATE ER 25 MG PO TB24: 25.0000 mg | ORAL_TABLET | Freq: Every evening | ORAL | 1 refills | Status: DC

## 2020-11-19 MED ORDER — AMLODIPINE BESYLATE 10 MG PO TABS: 10.0000 mg | ORAL_TABLET | Freq: Every day | ORAL | 1 refills | Status: DC

## 2020-11-19 MED ORDER — SILDENAFIL CITRATE 100 MG PO TABS: 50.0000 mg | ORAL_TABLET | Freq: Every day | ORAL | 6 refills | Status: DC | PRN

## 2020-11-19 NOTE — Assessment & Plan Note (Signed)
Chronic, stable on current regimen.  Continue amlodipine and toprol XL

## 2020-11-19 NOTE — Patient Instructions (Addendum)
You are doing well today Continue current medicines.  Return as needed or in 3-6 months for physical.

## 2020-11-19 NOTE — Progress Notes (Signed)
Patient ID: Andrew Phillips, male    DOB: [birth year], 45 y.o.   MRN: [id number]  This visit was conducted in person.  BP 130/82   Pulse 68   Temp 97.6 F (36.4 C) (Temporal)   Ht 6' 1.25" (1.861 m)   Wt 251 lb 8 oz (114.1 kg)   SpO2 99%   BMI 32.96 kg/m   BP Readings from Last 3 Encounters:  11/19/20 130/82  02/15/20 (!) 148/98  01/26/20 (!) 148/100    CC: f/u visit  Subjective:   HPI: Andrew Phillips is a 45 y.o. male presenting on 11/19/2020 for Hypertension (Here for 6 mo f/u.)   Recently moved to [place]. Has changed insurance plan but insurance carrier remains same.  Unsure if he can do CPE today - will defer.   HTN - Compliant with current antihypertensive regimen of amlodipine 29m daily, toprol XL 214mdaily. Does check blood pressures at home: 130s/80-90. No low blood pressure readings or symptoms of dizziness/syncope. Denies HA, vision changes, CP/tightness, SOB, leg swelling.    Recurrent foot pain - likely gout as colchicine has helped keep episodes under control. Has had 2 episodes since last seen. Reviewed low purine diet - not regular with alcohol intake.   Weight went up (peak 268lbs) slowly dropping with healthy diet choices.      Relevant past medical, surgical, family and social history reviewed and updated as indicated. Interim medical history since our last visit reviewed. Allergies and medications reviewed and updated. Outpatient Medications Prior to Visit  Medication Sig Dispense Refill  . colchicine 0.6 MG tablet Take 1 tablet (0.6 mg total) by mouth daily as needed (gout flare). Take 2 tablets on first day of gout flare 30 tablet 3  . amLODipine (NORVASC) 10 MG tablet Take 1 tablet (10 mg total) by mouth daily. 90 tablet 3  . metoprolol succinate (TOPROL-XL) 25 MG 24 hr tablet Take 1 tablet (25 mg total) by mouth at bedtime. 90 tablet 3  . sildenafil (VIAGRA) 100 MG tablet Take 0.5-1 tablets (50-100 mg total) by mouth daily as needed for erectile  dysfunction. 6 tablet 11   No facility-administered medications prior to visit.     Per HPI unless specifically indicated in ROS section below Review of Systems Objective:  BP 130/82   Pulse 68   Temp 97.6 F (36.4 C) (Temporal)   Ht 6' 1.25" (1.861 m)   Wt 251 lb 8 oz (114.1 kg)   SpO2 99%   BMI 32.96 kg/m   Wt Readings from Last 3 Encounters:  11/19/20 251 lb 8 oz (114.1 kg)  02/15/20 256 lb 11.2 oz (116.4 kg)  01/26/20 252 lb 9 oz (114.6 kg)      Physical Exam Vitals and nursing note reviewed.  Constitutional:      Appearance: Normal appearance. He is not ill-appearing.  Cardiovascular:     Rate and Rhythm: Normal rate and regular rhythm.     Pulses: Normal pulses.     Heart sounds: Normal heart sounds. No murmur heard.   Pulmonary:     Effort: Pulmonary effort is normal. No respiratory distress.     Breath sounds: Normal breath sounds. No wheezing, rhonchi or rales.  Musculoskeletal:     Right lower leg: No edema.     Left lower leg: No edema.  Skin:    General: Skin is warm and dry.     Findings: No rash.  Neurological:     Mental Status: He  is alert.  Psychiatric:        Mood and Affect: Mood normal.        Behavior: Behavior normal.       Results for orders placed or performed in visit on 02/15/20  Lipid panel  Result Value Ref Range   Cholesterol 181 0 - 200 mg/dL   Triglycerides 192.0 (H) 0.0 - 149.0 mg/dL   HDL 31.80 (L) >39.00 mg/dL   VLDL 38.4 0.0 - 40.0 mg/dL   LDL Cholesterol 110 (H) 0 - 99 mg/dL   Total CHOL/HDL Ratio 6    NonHDL 148.80   Comprehensive metabolic panel  Result Value Ref Range   Sodium 138 135 - 145 mEq/L   Potassium 3.8 3.5 - 5.1 mEq/L   Chloride 101 96 - 112 mEq/L   CO2 28 19 - 32 mEq/L   Glucose, Bld 109 (H) 70 - 99 mg/dL   BUN 14 6 - 23 mg/dL   Creatinine, Ser 0.86 0.40 - 1.50 mg/dL   Total Bilirubin 0.8 0.2 - 1.2 mg/dL   Alkaline Phosphatase 62 39 - 117 U/L   AST 48 (H) 0 - 37 U/L   ALT 95 (H) 0 - 53 U/L    Total Protein 7.4 6.0 - 8.3 g/dL   Albumin 4.8 3.5 - 5.2 g/dL   GFR 97.33 >60.00 mL/min   Calcium 9.5 8.4 - 10.5 mg/dL  Uric acid  Result Value Ref Range   Uric Acid, Serum 9.5 (H) 4.0 - 7.8 mg/dL  TSH  Result Value Ref Range   TSH 0.95 0.35 - 4.50 uIU/mL  Microalbumin / creatinine urine ratio  Result Value Ref Range   Microalb, Ur 2.9 (H) 0.0 - 1.9 mg/dL   Creatinine,U 91.5 mg/dL   Microalb Creat Ratio 3.1 0.0 - 30.0 mg/g   Assessment & Plan:  This visit occurred during the SARS-CoV-2 public health emergency.  Safety protocols were in place, including screening questions prior to the visit, additional usage of staff PPE, and extensive cleaning of exam room while observing appropriate contact time as indicated for disinfecting solutions.   Problem List Items Addressed This Visit    Essential hypertension - Primary    Chronic, stable on current regimen.  Continue amlodipine and toprol XL       Relevant Medications   sildenafil (VIAGRA) 100 MG tablet   amLODipine (NORVASC) 10 MG tablet   metoprolol succinate (TOPROL-XL) 25 MG 24 hr tablet   Obesity, Class I, BMI 30-34.9    Working on healthy diet choices to affect sustainable weight loss.       ED (erectile dysfunction) of organic origin    viagra effective.       Idiopathic chronic gout of left foot without tophus    Stable period on PRN colchicine - has had 1-2 flares in the last 9 months, well managed with colchicine. Reviewed low purine diet recommendations.           Meds ordered this encounter  Medications  . sildenafil (VIAGRA) 100 MG tablet    Sig: Take 0.5-1 tablets (50-100 mg total) by mouth daily as needed for erectile dysfunction.    Dispense:  6 tablet    Refill:  6  . amLODipine (NORVASC) 10 MG tablet    Sig: Take 1 tablet (10 mg total) by mouth daily.    Dispense:  90 tablet    Refill:  1  . metoprolol succinate (TOPROL-XL) 25 MG 24 hr tablet    Sig: Take  1 tablet (25 mg total) by mouth at bedtime.     Dispense:  90 tablet    Refill:  1   No orders of the defined types were placed in this encounter.   Patient Instructions  You are doing well today Continue current medicines.  Return as needed or in 3-6 months for physical.    Follow up plan: Return in about 4 months (around 03/21/2021) for annual exam, prior fasting for blood work.  Ria Bush, MD

## 2020-11-19 NOTE — Assessment & Plan Note (Signed)
viagra effective.

## 2020-11-19 NOTE — Assessment & Plan Note (Signed)
Working on [employer] choices to affect sustainable weight loss.

## 2020-11-19 NOTE — Assessment & Plan Note (Signed)
Stable period on PRN colchicine - has had 1-2 flares in the last 9 months, well managed with colchicine. Reviewed low purine diet recommendations.

## 2021-03-09 ENCOUNTER — Other Ambulatory Visit: Payer: Self-pay | Admitting: Family Medicine

## 2021-03-09 DIAGNOSIS — I10 Essential (primary) hypertension: Secondary | ICD-10-CM

## 2021-03-09 DIAGNOSIS — M1A0720 Idiopathic chronic gout, left ankle and foot, without tophus (tophi): Secondary | ICD-10-CM

## 2021-03-09 DIAGNOSIS — E782 Mixed hyperlipidemia: Secondary | ICD-10-CM

## 2021-03-14 ENCOUNTER — Other Ambulatory Visit (INDEPENDENT_AMBULATORY_CARE_PROVIDER_SITE_OTHER): Payer: 59

## 2021-03-14 ENCOUNTER — Other Ambulatory Visit: Payer: Self-pay

## 2021-03-14 DIAGNOSIS — E782 Mixed hyperlipidemia: Secondary | ICD-10-CM | POA: Diagnosis not present

## 2021-03-14 DIAGNOSIS — M1A0720 Idiopathic chronic gout, left ankle and foot, without tophus (tophi): Secondary | ICD-10-CM

## 2021-03-14 DIAGNOSIS — I10 Essential (primary) hypertension: Secondary | ICD-10-CM

## 2021-03-14 LAB — MICROALBUMIN / CREATININE URINE RATIO
Creatinine,U: 30.1 mg/dL
Microalb Creat Ratio: 3.9 mg/g (ref 0.0–30.0)
Microalb, Ur: 1.2 mg/dL (ref 0.0–1.9)

## 2021-03-14 LAB — LIPID PANEL
Cholesterol: 208 mg/dL — ABNORMAL HIGH (ref 0–200)
HDL: 34.3 mg/dL — ABNORMAL LOW (ref >39.00)
LDL Cholesterol: 136 mg/dL — ABNORMAL HIGH (ref 0–99)
NonHDL: 173.22
Total CHOL/HDL Ratio: 6
Triglycerides: 184 mg/dL — ABNORMAL HIGH (ref 0.0–149.0)
VLDL: 36.8 mg/dL (ref 0.0–40.0)

## 2021-03-14 LAB — COMPREHENSIVE METABOLIC PANEL
ALT: 80 U/L — ABNORMAL HIGH (ref 0–53)
AST: 44 U/L — ABNORMAL HIGH (ref 0–37)
Albumin: 4.9 g/dL (ref 3.5–5.2)
Alkaline Phosphatase: 67 U/L (ref 39–117)
BUN: 14 mg/dL (ref 6–23)
CO2: 26 mEq/L (ref 19–32)
Calcium: 9.7 mg/dL (ref 8.4–10.5)
Chloride: 100 mEq/L (ref 96–112)
Creatinine, Ser: 0.92 mg/dL (ref 0.40–1.50)
GFR: 101.89 mL/min (ref >60.00)
Glucose, Bld: 97 mg/dL (ref 70–99)
Potassium: 3.9 mEq/L (ref 3.5–5.1)
Sodium: 136 mEq/L (ref 135–145)
Total Bilirubin: 1.1 mg/dL (ref 0.2–1.2)
Total Protein: 7.9 g/dL (ref 6.0–8.3)

## 2021-03-14 LAB — URIC ACID: Uric Acid, Serum: 9.1 mg/dL — ABNORMAL HIGH (ref 4.0–7.8)

## 2021-03-21 ENCOUNTER — Ambulatory Visit (INDEPENDENT_AMBULATORY_CARE_PROVIDER_SITE_OTHER): Payer: 59 | Admitting: Family Medicine

## 2021-03-21 ENCOUNTER — Encounter: Payer: Self-pay | Admitting: Family Medicine

## 2021-03-21 ENCOUNTER — Other Ambulatory Visit: Payer: Self-pay

## 2021-03-21 VITALS — BP 146/98 | HR 76 | Temp 98.1°F | Ht 73.5 in | Wt 254.6 lb

## 2021-03-21 DIAGNOSIS — I10 Essential (primary) hypertension: Secondary | ICD-10-CM | POA: Diagnosis not present

## 2021-03-21 DIAGNOSIS — M1A0720 Idiopathic chronic gout, left ankle and foot, without tophus (tophi): Secondary | ICD-10-CM

## 2021-03-21 DIAGNOSIS — E782 Mixed hyperlipidemia: Secondary | ICD-10-CM | POA: Diagnosis not present

## 2021-03-21 DIAGNOSIS — Z0000 Encounter for general adult medical examination without abnormal findings: Secondary | ICD-10-CM | POA: Diagnosis not present

## 2021-03-21 DIAGNOSIS — K7581 Nonalcoholic steatohepatitis (NASH): Secondary | ICD-10-CM

## 2021-03-21 DIAGNOSIS — E669 Obesity, unspecified: Secondary | ICD-10-CM

## 2021-03-21 MED ORDER — METOPROLOL SUCCINATE ER 25 MG PO TB24: 25.0000 mg | ORAL_TABLET | Freq: Every evening | ORAL | 3 refills | Status: DC

## 2021-03-21 MED ORDER — COLCHICINE 0.6 MG PO TABS: 0.6000 mg | ORAL_TABLET | Freq: Every day | ORAL | 3 refills | Status: DC | PRN

## 2021-03-21 MED ORDER — ALLOPURINOL 100 MG PO TABS: 100.0000 mg | ORAL_TABLET | Freq: Every day | ORAL | 3 refills | Status: DC

## 2021-03-21 MED ORDER — AMLODIPINE BESYLATE 10 MG PO TABS: 10.0000 mg | ORAL_TABLET | Freq: Every day | ORAL | 3 refills | Status: DC

## 2021-03-21 MED ORDER — METOPROLOL SUCCINATE ER 50 MG PO TB24: 50.0000 mg | ORAL_TABLET | Freq: Every evening | ORAL | 3 refills | Status: DC

## 2021-03-21 NOTE — Patient Instructions (Signed)
Start allopurinol 133m daily for gout prevention.  Increase metoprolol to 541mdaily for better blood pressure control  Return in 3-4 months for BP f/u visit.  Good to see you today.   Health Maintenance, Male Adopting a healthy lifestyle and getting preventive care are important in promoting health and wellness. Ask your health care provider about: The right schedule for you to have regular tests and exams. Things you can do on your own to prevent diseases and keep yourself healthy. What should I know about diet, weight, and exercise? Eat a healthy diet  Eat a diet that includes plenty of vegetables, fruits, low-fat dairy products, and lean protein. Do not eat a lot of foods that are high in solid fats, added sugars, or sodium.  Maintain a healthy weight Body mass index (BMI) is a measurement that can be used to identify possible weight problems. It estimates body fat based on height and weight. Your health care provider can help determine your BMI and help you achieve or maintain ahealthy weight. Get regular exercise Get regular exercise. This is one of the most important things you can do for your health. Most adults should: Exercise for at least 150 minutes each week. The exercise should increase your heart rate and make you sweat (moderate-intensity exercise). Do strengthening exercises at least twice a week. This is in addition to the moderate-intensity exercise. Spend less time sitting. Even light physical activity can be beneficial. Watch cholesterol and blood lipids Have your blood tested for lipids and cholesterol at 2056ears of age, then havethis test every 5 years. You may need to have your cholesterol levels checked more often if: Your lipid or cholesterol levels are high. You are older than 4031ears of age. You are at high risk for heart disease. What should I know about cancer screening? Many types of cancers can be detected early and may often be prevented. Depending on  your health history and family history, you may need to have cancer screening at various ages. This may include screening for: Colorectal cancer. Prostate cancer. Skin cancer. Lung cancer. What should I know about heart disease, diabetes, and high blood pressure? Blood pressure and heart disease High blood pressure causes heart disease and increases the risk of stroke. This is more likely to develop in people who have high blood pressure readings, are of African descent, or are overweight. Talk with your health care provider about your target blood pressure readings. Have your blood pressure checked: Every 3-5 years if you are [id number] of age. Every year if you are 4065ears old or older. If you are between the ages of 6579nd 7550nd are a current or former smoker, ask your health care provider if you should have a one-time screening for abdominal aortic aneurysm (AAA). Diabetes Have regular diabetes screenings. This checks your fasting blood sugar level. Have the screening done: Once every three years after age 7414f you are at a normal weight and have a low risk for diabetes. More often and at a younger age if you are overweight or have a high risk for diabetes. What should I know about preventing infection? Hepatitis B If you have a higher risk for hepatitis B, you should be screened for this virus. Talk with your health care provider to find out if you are at risk forhepatitis B infection. Hepatitis C Blood testing is recommended for: Everyone born from 1941hrough 1965. Anyone with known risk factors for hepatitis C. Sexually transmitted  infections (STIs) You should be screened each year for STIs, including gonorrhea and chlamydia, if: You are sexually active and are younger than 43 years of age. You are older than 43 years of age and your health care provider tells you that you are at risk for this type of infection. Your sexual activity has changed since you were last screened,  and you are at increased risk for chlamydia or gonorrhea. Ask your health care provider if you are at risk. Ask your health care provider about whether you are at high risk for HIV. Your health care provider may recommend a prescription medicine to help prevent HIV infection. If you choose to take medicine to prevent HIV, you should first get tested for HIV. You should then be tested every 3 months for as long as you are taking the medicine. Follow these instructions at home: Lifestyle Do not use any products that contain nicotine or tobacco, such as cigarettes, e-cigarettes, and chewing tobacco. If you need help quitting, ask your health care provider. Do not use street drugs. Do not share needles. Ask your health care provider for help if you need support or information about quitting drugs. Alcohol use Do not drink alcohol if your health care provider tells you not to drink. If you drink alcohol: Limit how much you have to 0-2 drinks a day. Be aware of how much alcohol is in your drink. In the [place] one drink equals one 12 oz bottle of beer (355 mL), one 5 oz glass of wine (148 mL), or one 1 oz glass of hard liquor (44 mL). General instructions Schedule regular health, dental, and eye exams. Stay current with your vaccines. Tell your health care provider if: You often feel depressed. You have ever been abused or do not feel safe at home. Summary Adopting a healthy lifestyle and getting preventive care are important in promoting health and wellness. Follow your health care provider's instructions about healthy diet, exercising, and getting tested or screened for diseases. Follow your health care provider's instructions on monitoring your cholesterol and blood pressure. This information is not intended to replace advice given to you by your health care provider. Make sure you discuss any questions you have with your healthcare provider. Document Revised: 08/17/2018 Document Reviewed:  08/17/2018 Elsevier Patient Education  2022 [employer].

## 2021-03-21 NOTE — Assessment & Plan Note (Signed)
Chronic, remaining above goal. Increase Toprol XL to 109m daily. Discussed possible ARB (caution if h/o ACEI-induced angioedema). RTC 3 mo f/u visit.

## 2021-03-21 NOTE — Assessment & Plan Note (Signed)
Encouraged healthy diet and lifestyle choices to affect sustainable weight loss.

## 2021-03-21 NOTE — Assessment & Plan Note (Addendum)
2 flares per year. Elevated gout levels. Will start allopurinol 15m daily, monitor for side effects, rash, etc.  Continue PRN colchicine.

## 2021-03-21 NOTE — Assessment & Plan Note (Signed)
Preventative protocols reviewed and updated unless pt declined. Discussed healthy diet and lifestyle.  

## 2021-03-21 NOTE — Progress Notes (Signed)
Patient ID: Andrew Phillips, male    DOB: 25-Mar-1976, 45 y.o.   MRN: [id number]  This visit was conducted in person.  BP (!) 146/98 (BP Location: Right Arm, Cuff Size: Large)   Pulse 76   Temp 98.1 F (36.7 C) (Temporal)   Ht 6' 1.5" (1.867 m)   Wt 254 lb 9 oz (115.5 kg)   SpO2 97%   BMI 33.13 kg/m    CC: CPE Subjective:   HPI: Andrew Phillips is a 45 y.o. male presenting on 03/21/2021 for Annual Exam   Gout - manages with PRN colchicine, last flare 2 wks ago. Avoiding thiazides. Gest on average 2 flares a year.   HTN - BP readings at home overall better controlled 135-140/85.   Preventative: Colon cancer screening - no fmhx Flu shot - declines COVID vaccine - Pfizer 11/2019, 12/2019, booster 09/2020 Tdap 2012 Shingrix - not due Seat belt use discussed Sunscreen use discussed, no changing moles on skin. 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 Interim medical history since our last visit reviewed. Allergies and medications reviewed and updated. Outpatient Medications Prior to Visit  Medication Sig Dispense Refill   sildenafil (VIAGRA) 100 MG tablet Take 0.5-1 tablets (50-100 mg total) by mouth daily as needed for erectile dysfunction. 6 tablet 6   amLODipine (NORVASC) 10 MG tablet Take 1 tablet (10 mg total) by mouth daily. 90 tablet 1   colchicine 0.6 MG tablet Take 1 tablet (0.6 mg total) by mouth daily as needed (gout flare). Take 2 tablets on first day of gout flare 30 tablet 3   metoprolol succinate (TOPROL-XL) 25 MG 24 hr tablet Take 1 tablet (25 mg total) by mouth at bedtime. 90 tablet 1   amLODipine (NORVASC) 10 MG tablet TAKE 1 TABLET (10 MG TOTAL) BY MOUTH  DAILY. 90 tablet 3   metoprolol succinate (TOPROL-XL) 25 MG 24 hr tablet TAKE 1 TABLET (25 MG TOTAL) BY MOUTH AT BEDTIME. 30 tablet 11   sildenafil (VIAGRA) 100 MG tablet TAKE 1/2 TO 1 TABLET BY MOUTH DAILY AS NEEDED FOR ERECTILE DYSFUNCTION. 6 tablet 11   No facility-administered medications prior to visit.     Per HPI unless specifically indicated in ROS section below Review of Systems  Constitutional:  Negative for activity change, appetite change, chills, fatigue, fever and unexpected weight change.  HENT:  Negative for hearing loss.   Eyes:  Negative for visual disturbance.  Respiratory:  Negative for cough, chest tightness, shortness of breath and wheezing.   Cardiovascular:  Negative for chest pain, palpitations and leg swelling.  Gastrointestinal:  Negative for abdominal distention, abdominal pain, blood in stool, constipation, diarrhea, nausea and vomiting.  Genitourinary:  Negative for difficulty urinating and hematuria.  Musculoskeletal:  Negative for arthralgias, myalgias and neck pain.  Skin:  Negative for rash.  Neurological:  Negative for dizziness, seizures, syncope and headaches.  Hematological:  Negative for adenopathy. Does not bruise/bleed easily.  Psychiatric/Behavioral:  Negative for dysphoric mood. The patient is not nervous/anxious.    Objective:  BP (!) 146/98 (BP Location: Right Arm, Cuff Size: Large)   Pulse 76   Temp 98.1 F (  36.7 C) (Temporal)   Ht 6' 1.5" (1.867 m)   Wt 254 lb 9 oz (115.5 kg)   SpO2 97%   BMI 33.13 kg/m   Wt Readings from Last 3 Encounters:  03/21/21 254 lb 9 oz (115.5 kg)  11/19/20 251 lb 8 oz (114.1 kg)  02/15/20 256 lb 11.2 oz (116.4 kg)      Physical Exam Vitals and nursing note reviewed.  Constitutional:      General: He is not in acute distress.    Appearance: Normal appearance. He is well-developed. He is not ill-appearing.  HENT:     Head: Normocephalic and atraumatic.     Right Ear: Hearing, tympanic membrane, ear  canal and external ear normal.     Left Ear: Hearing, tympanic membrane, ear canal and external ear normal.  Eyes:     General: No scleral icterus.    Extraocular Movements: Extraocular movements intact.     Conjunctiva/sclera: Conjunctivae normal.     Pupils: Pupils are equal, round, and reactive to light.  Neck:     Thyroid: No thyroid mass or thyromegaly.  Cardiovascular:     Rate and Rhythm: Normal rate and regular rhythm.     Pulses: Normal pulses.          Radial pulses are 2+ on the right side and 2+ on the left side.     Heart sounds: Normal heart sounds. No murmur heard. Pulmonary:     Effort: Pulmonary effort is normal. No respiratory distress.     Breath sounds: Normal breath sounds. No wheezing, rhonchi or rales.  Abdominal:     General: Bowel sounds are normal. There is no distension.     Palpations: Abdomen is soft. There is no mass.     Tenderness: There is no abdominal tenderness. There is no guarding or rebound.     Hernia: No hernia is present.  Musculoskeletal:        General: Normal range of motion.     Cervical back: Normal range of motion and neck supple.     Right lower leg: No edema.     Left lower leg: No edema.  Lymphadenopathy:     Cervical: No cervical adenopathy.  Skin:    General: Skin is warm and dry.     Findings: No rash.  Neurological:     General: No focal deficit present.     Mental Status: He is alert and oriented to person, place, and time.  Psychiatric:        Mood and Affect: Mood normal.        Behavior: Behavior normal.        Thought Content: Thought content normal.        Judgment: Judgment normal.      Results for orders placed or performed in visit on 03/14/21  Uric acid  Result Value Ref Range   Uric Acid, Serum 9.1 (H) 4.0 - 7.8 mg/dL  Microalbumin / creatinine urine ratio  Result Value Ref Range   Microalb, Ur 1.2 0.0 - 1.9 mg/dL   Creatinine,U 30.1 mg/dL   Microalb Creat Ratio 3.9 0.0 - 30.0 mg/g  Comprehensive  metabolic panel  Result Value Ref Range   Sodium 136 135 - 145 mEq/L   Potassium 3.9 3.5 - 5.1 mEq/L   Chloride 100 96 - 112 mEq/L   CO2 26 19 - 32 mEq/L   Glucose, Bld 97 70 - 99 mg/dL   BUN 14 6 - 23 mg/dL  Creatinine, Ser 0.92 0.40 - 1.50 mg/dL   Total Bilirubin 1.1 0.2 - 1.2 mg/dL   Alkaline Phosphatase 67 39 - 117 U/L   AST 44 (H) 0 - 37 U/L   ALT 80 (H) 0 - 53 U/L   Total Protein 7.9 6.0 - 8.3 g/dL   Albumin 4.9 3.5 - 5.2 g/dL   GFR 101.89 >60.00 mL/min   Calcium 9.7 8.4 - 10.5 mg/dL  Lipid panel  Result Value Ref Range   Cholesterol 208 (H) 0 - 200 mg/dL   Triglycerides 184.0 (H) 0.0 - 149.0 mg/dL   HDL 34.30 (L) >39.00 mg/dL   VLDL 36.8 0.0 - 40.0 mg/dL   LDL Cholesterol 136 (H) 0 - 99 mg/dL   Total CHOL/HDL Ratio 6    NonHDL 173.22     Assessment & Plan:  This visit occurred during the SARS-CoV-2 public health emergency.  Safety protocols were in place, including screening questions prior to the visit, additional usage of staff PPE, and extensive cleaning of exam room while observing appropriate contact time as indicated for disinfecting solutions.   Problem List Items Addressed This Visit     HLD (hyperlipidemia)    Encouraged healthy diet choices to control cholesterol levels. The 10-year ASCVD risk score Mikey Bussing DC Brooke Bonito., et al., 2013) is: 4.2%   Values used to calculate the score:     Age: 20 years     Sex: Male     Is Non-Hispanic African American: No     Diabetic: No     Tobacco smoker: No     Systolic Blood Pressure: 426 mmHg     Is BP treated: Yes     HDL Cholesterol: 34.3 mg/dL     Total Cholesterol: 208 mg/dL        Relevant Medications   amLODipine (NORVASC) 10 MG tablet   metoprolol succinate (TOPROL-XL) 50 MG 24 hr tablet   Essential hypertension    Chronic, remaining above goal. Increase Toprol XL to 78m daily. Discussed possible ARB (caution if h/o ACEI-induced angioedema). RTC 3 mo f/u visit.        Relevant Medications   amLODipine  (NORVASC) 10 MG tablet   metoprolol succinate (TOPROL-XL) 50 MG 24 hr tablet   NASH (nonalcoholic steatohepatitis)    With persistent mild transaminitis.  RUQ [id number] with presumed fatty liver changes.  Encouraged renewed efforts towards weight loss.        Obesity, Class I, BMI 30-34.9    Encouraged healthy diet and lifestyle choices to affect sustainable weight loss.        Health maintenance examination - Primary    Preventative protocols reviewed and updated unless pt declined. Discussed healthy diet and lifestyle.        Idiopathic chronic gout of left foot without tophus    2 flares per year. Elevated gout levels. Will start allopurinol 1055mdaily, monitor for side effects, rash, etc.  Continue PRN colchicine.        Relevant Medications   allopurinol (ZYLOPRIM) 100 MG tablet   colchicine 0.6 MG tablet     Meds ordered this encounter  Medications   allopurinol (ZYLOPRIM) 100 MG tablet    Sig: Take 1 tablet (100 mg total) by mouth daily.    Dispense:  90 tablet    Refill:  3   DISCONTD: metoprolol succinate (TOPROL-XL) 25 MG 24 hr tablet    Sig: Take 1 tablet (25 mg total) by mouth at bedtime.    Dispense:  90 tablet    Refill:  3   amLODipine (NORVASC) 10 MG tablet    Sig: Take 1 tablet (10 mg total) by mouth daily.    Dispense:  90 tablet    Refill:  3   colchicine 0.6 MG tablet    Sig: Take 1 tablet (0.6 mg total) by mouth daily as needed (gout flare). Take 2 tablets on first day of gout flare    Dispense:  30 tablet    Refill:  3   metoprolol succinate (TOPROL-XL) 50 MG 24 hr tablet    Sig: Take 1 tablet (50 mg total) by mouth at bedtime.    Dispense:  90 tablet    Refill:  3    Note new dose - use 11m    No orders of the defined types were placed in this encounter.   Patient instructions: Start allopurinol 1044mdaily for gout prevention.  Increase metoprolol to 5039maily for better blood pressure control  Return in 3-4 months for BP f/u  visit.  Good to see you today.   Follow up plan: Return in about 3 months (around 06/21/2021) for follow up visit.  JavRia BushD

## 2021-03-21 NOTE — Assessment & Plan Note (Signed)
With persistent mild transaminitis.  RUQ [place] 2019 with presumed fatty liver changes.  Encouraged renewed efforts towards weight loss.

## 2021-03-21 NOTE — Assessment & Plan Note (Signed)
Encouraged healthy diet choices to control cholesterol levels. The 10-year ASCVD risk score Mikey Bussing DC Brooke Bonito., et al., 2013) is: 4.2%   Values used to calculate the score:     Age: 45 years     Sex: Male     Is Non-Hispanic African American: No     Diabetic: No     Tobacco smoker: No     Systolic Blood Pressure: 375 mmHg     Is BP treated: Yes     HDL Cholesterol: 34.3 mg/dL     Total Cholesterol: 208 mg/dL

## 2021-06-20 ENCOUNTER — Encounter: Payer: Self-pay | Admitting: Family Medicine

## 2021-06-20 ENCOUNTER — Other Ambulatory Visit: Payer: Self-pay

## 2021-06-20 ENCOUNTER — Ambulatory Visit: Payer: 59 | Admitting: Family Medicine

## 2021-06-20 DIAGNOSIS — E669 Obesity, unspecified: Secondary | ICD-10-CM | POA: Diagnosis not present

## 2021-06-20 DIAGNOSIS — I10 Essential (primary) hypertension: Secondary | ICD-10-CM | POA: Diagnosis not present

## 2021-06-20 NOTE — Assessment & Plan Note (Signed)
Chronic, remaining mildly elevated.  Continue current medicines, reviewed healthy diet and lifestyle choices to maintain good BP control.  Reassess at 4 mo f/u visit.

## 2021-06-20 NOTE — Assessment & Plan Note (Addendum)
Continue to encourage healthy diet and lifestyle choices to affect sustainable weight loss Discussed being more deliberate about meal choices as well as restarting regular aerobic exercise routine - he has treadmill and eliptical at home

## 2021-06-20 NOTE — Patient Instructions (Addendum)
You are doing well but blood pressure is staying borderline.  Continue current medicines.  Renew efforts at preparing meals or deliberate meal choices as well as restart aerobic exercise routine. This will help keep blood pressures under control.  Return in 4 months for follow up visit to ensure everything is staying well controlled.

## 2021-06-20 NOTE — Progress Notes (Signed)
Patient ID: Andrew Phillips, male    DOB: March 12, 1976, 45 y.o.   MRN: [id number]  This visit was conducted in person.  BP 138/84   Pulse 72   Temp 97.6 F (36.4 C) (Temporal)   Ht 6' 1.5" (1.867 m)   Wt 264 lb 3 oz (119.8 kg)   SpO2 97%   BMI 34.38 kg/m    CC: HTN f/u  Subjective:   HPI: Andrew Phillips is a 45 y.o. male presenting on 06/20/2021 for Hypertension (Here for 3 mo f/u.)   HTN - Compliant with current antihypertensive regimen of amlodipine 41m daily, toprol XL 517mdaily.  Does check blood pressures at home: 130-140/80s. No low blood pressure readings or symptoms of dizziness/syncope. Denies HA, vision changes, CP/tightness, SOB, leg swelling.   Gout - doing well on daily allopurinol 1005mithout recent gout flares.      Relevant past medical, surgical, family and social history reviewed and updated as indicated. Interim medical history since our last visit reviewed. Allergies and medications reviewed and updated. Outpatient Medications Prior to Visit  Medication Sig Dispense Refill   allopurinol (ZYLOPRIM) 100 MG tablet Take 1 tablet (100 mg total) by mouth daily. 90 tablet 3   amLODipine (NORVASC) 10 MG tablet Take 1 tablet (10 mg total) by mouth daily. 90 tablet 3   colchicine 0.6 MG tablet Take 1 tablet (0.6 mg total) by mouth daily as needed (gout flare). Take 2 tablets on first day of gout flare 30 tablet 3   metoprolol succinate (TOPROL-XL) 50 MG 24 hr tablet Take 1 tablet (50 mg total) by mouth at bedtime. 90 tablet 3   sildenafil (VIAGRA) 100 MG tablet Take 0.5-1 tablets (50-100 mg total) by mouth daily as needed for erectile dysfunction. 6 tablet 6   No facility-administered medications prior to visit.     Per HPI unless specifically indicated in ROS section below Review of Systems  Objective:  BP 138/84   Pulse 72   Temp 97.6 F (36.4 C) (Temporal)   Ht 6' 1.5" (1.867 m)   Wt 264 lb 3 oz (119.8 kg)   SpO2 97%   BMI 34.38 kg/m   Wt Readings from  Last 3 Encounters:  06/20/21 264 lb 3 oz (119.8 kg)  03/21/21 254 lb 9 oz (115.5 kg)  11/19/20 251 lb 8 oz (114.1 kg)      Physical Exam Vitals and nursing note reviewed.  Constitutional:      Appearance: Normal appearance. He is not ill-appearing.  Eyes:     Extraocular Movements: Extraocular movements intact.     Pupils: Pupils are equal, round, and reactive to light.  Neck:     Thyroid: No thyroid mass or thyromegaly.  Cardiovascular:     Rate and Rhythm: Normal rate and regular rhythm.     Pulses: Normal pulses.     Heart sounds: Normal heart sounds. No murmur heard. Pulmonary:     Effort: Pulmonary effort is normal. No respiratory distress.     Breath sounds: Normal breath sounds. No wheezing, rhonchi or rales.  Musculoskeletal:     Cervical back: Normal range of motion and neck supple. No rigidity.     Right lower leg: No edema.     Left lower leg: No edema.  Lymphadenopathy:     Cervical: No cervical adenopathy.  Skin:    General: Skin is warm and dry.     Findings: No rash.  Neurological:     Mental  Status: He is alert.  Psychiatric:        Mood and Affect: Mood normal.        Behavior: Behavior normal.       Assessment & Plan:  This visit occurred during the SARS-CoV-2 public health emergency.  Safety protocols were in place, including screening questions prior to the visit, additional usage of staff PPE, and extensive cleaning of exam room while observing appropriate contact time as indicated for disinfecting solutions.   Problem List Items Addressed This Visit     Essential hypertension    Chronic, remaining mildly elevated.  Continue current medicines, reviewed healthy diet and lifestyle choices to maintain good BP control.  Reassess at 4 mo f/u visit.       Obesity, Class I, BMI 30-34.9    Continue to encourage healthy diet and lifestyle choices to affect sustainable weight loss Discussed being more deliberate about meal choices as well as restarting  regular aerobic exercise routine - he has treadmill and eliptical at home        No orders of the defined types were placed in this encounter.  No orders of the defined types were placed in this encounter.    Patient Instructions  You are doing well but blood pressure is staying borderline.  Continue current medicines.  Renew efforts at preparing meals or deliberate meal choices as well as restart aerobic exercise routine. This will help keep blood pressures under control.  Return in 4 months for follow up visit to ensure everything is staying well controlled.   Follow up plan: Return in about 4 months (around 10/21/2021) for follow up visit.  Andrew Bush, MD

## 2021-10-24 ENCOUNTER — Ambulatory Visit: Payer: 59 | Admitting: Family Medicine

## 2021-11-07 ENCOUNTER — Ambulatory Visit (INDEPENDENT_AMBULATORY_CARE_PROVIDER_SITE_OTHER): Payer: 59 | Admitting: Family Medicine

## 2021-11-07 ENCOUNTER — Encounter: Payer: Self-pay | Admitting: Family Medicine

## 2021-11-07 ENCOUNTER — Other Ambulatory Visit: Payer: Self-pay

## 2021-11-07 VITALS — BP 134/84 | HR 65 | Temp 97.5°F | Ht 73.5 in | Wt 249.0 lb

## 2021-11-07 DIAGNOSIS — I10 Essential (primary) hypertension: Secondary | ICD-10-CM | POA: Diagnosis not present

## 2021-11-07 DIAGNOSIS — E669 Obesity, unspecified: Secondary | ICD-10-CM | POA: Diagnosis not present

## 2021-11-07 NOTE — Patient Instructions (Addendum)
Congratulations on weight loss to date! ?Continue current medicines.  ?Continue regular exercise routine and healthy diet choices.  ?Return after 03/21/2022 for physical ?

## 2021-11-07 NOTE — Assessment & Plan Note (Signed)
Congratulated on 15lb weight loss over the past 4 months. He is motivated to continue healthy diet and lifestyle choices for goal sustainable weight loss.  ?

## 2021-11-07 NOTE — Assessment & Plan Note (Signed)
Great control on current regimen of amlodipine and toprol XL - denies side effects. Continue current regimen.  ?

## 2021-11-07 NOTE — Progress Notes (Signed)
? ? Patient ID: Andrew Phillips, male    DOB: 1976-01-10, 46 y.o.   MRN: [id number] ? ?This visit was conducted in person. ? ?BP 134/84   Pulse 65   Temp (!) 97.5 ?F (36.4 ?C) (Temporal)   Ht 6' 1.5" (1.867 m)   Wt 249 lb (112.9 kg)   SpO2 99%   BMI 32.41 kg/m?   ? ?CC: HTN f/u visit  ?Subjective:  ? ?HPI: ?GREGGORY Phillips is a 46 y.o. male presenting on 11/07/2021 for Hypertension (Here for f/u.) ? ? ?HTN - Compliant with current antihypertensive regimen of amlodipine 57m daily,toprol XL 542mdaily. Does check blood pressures at home: 130/80s. No low blood pressure readings or symptoms of dizziness/syncope. Denies HA, vision changes, CP/tightness, SOB, leg swelling.  ? ?Obesity - working on weight loss - on elliptical 30 min several nights a week. Watching carbs and limiting sugars. ?   ? ?Relevant past medical, surgical, family and social history reviewed and updated as indicated. Interim medical history since our last visit reviewed. ?Allergies and medications reviewed and updated. ?Outpatient Medications Prior to Visit  ?Medication Sig Dispense Refill  ? allopurinol (ZYLOPRIM) 100 MG tablet Take 1 tablet (100 mg total) by mouth daily. 90 tablet 3  ? amLODipine (NORVASC) 10 MG tablet Take 1 tablet (10 mg total) by mouth daily. 90 tablet 3  ? colchicine 0.6 MG tablet Take 1 tablet (0.6 mg total) by mouth daily as needed (gout flare). Take 2 tablets on first day of gout flare 30 tablet 3  ? metoprolol succinate (TOPROL-XL) 50 MG 24 hr tablet Take 1 tablet (50 mg total) by mouth at bedtime. 90 tablet 3  ? sildenafil (VIAGRA) 100 MG tablet Take 0.5-1 tablets (50-100 mg total) by mouth daily as needed for erectile dysfunction. 6 tablet 6  ? ?No facility-administered medications prior to visit.  ?  ? ?Per HPI unless specifically indicated in ROS section below ?Review of Systems ? ?Objective:  ?BP 134/84   Pulse 65   Temp (!) 97.5 ?F (36.4 ?C) (Temporal)   Ht 6' 1.5" (1.867 m)   Wt 249 lb (112.9 kg)   SpO2 99%   BMI  32.41 kg/m?   ?Wt Readings from Last 3 Encounters:  ?11/07/21 249 lb (112.9 kg)  ?06/20/21 264 lb 3 oz (119.8 kg)  ?03/21/21 254 lb 9 oz (115.5 kg)  ?  ?  ?Physical Exam ?Vitals and nursing note reviewed.  ?Constitutional:   ?   Appearance: Normal appearance. He is not ill-appearing.  ?Neck:  ?   Thyroid: Thyromegaly (?) present. No thyroid mass or thyroid tenderness.  ?Cardiovascular:  ?   Rate and Rhythm: Normal rate and regular rhythm.  ?   Pulses: Normal pulses.  ?   Heart sounds: Normal heart sounds. No murmur heard. ?Pulmonary:  ?   Effort: Pulmonary effort is normal. No respiratory distress.  ?   Breath sounds: Normal breath sounds. No wheezing, rhonchi or rales.  ?Musculoskeletal:  ?   Right lower leg: No edema.  ?   Left lower leg: No edema.  ?Skin: ?   General: Skin is warm and dry.  ?   Findings: No rash.  ?Neurological:  ?   Mental Status: He is alert.  ?Psychiatric:     ?   Mood and Affect: Mood normal.     ?   Behavior: Behavior normal.  ? ?   ? ?Assessment & Plan:  ?This visit occurred during the SARS-CoV-2 public  health emergency.  Safety protocols were in place, including screening questions prior to the visit, additional usage of staff PPE, and extensive cleaning of exam room while observing appropriate contact time as indicated for disinfecting solutions.  ? ?Problem List Items Addressed This Visit   ? ? Essential hypertension - Primary  ?  Great control on current regimen of amlodipine and toprol XL - denies side effects. Continue current regimen.  ?  ?  ? Obesity, Class I, BMI 30-34.9  ?  Congratulated on 15lb weight loss over the past 4 months. He is motivated to continue healthy diet and lifestyle choices for goal sustainable weight loss.  ?  ?  ?  ? ?No orders of the defined types were placed in this encounter. ? ?No orders of the defined types were placed in this encounter. ? ? ? ?Patient Instructions  ?Congratulations on weight loss to date! ?Continue current medicines.  ?Continue regular  exercise routine and healthy diet choices.  ?Return after 03/21/2022 for physical ? ?Follow up plan: ?Return in about 4 months (around 03/22/2022), or if symptoms worsen or fail to improve, for annual exam, prior fasting for blood work. ? ?Ria Bush, MD   ?

## 2022-01-02 ENCOUNTER — Ambulatory Visit (HOSPITAL_COMMUNITY)
Admission: RE | Admit: 2022-01-02 | Discharge: 2022-01-02 | Disposition: A | Discharge status: Home / Self Care | Payer: 59 | Source: Ambulatory Visit | Attending: Family Medicine | Admitting: Family Medicine

## 2022-01-02 ENCOUNTER — Encounter (HOSPITAL_COMMUNITY): Payer: Self-pay

## 2022-01-02 VITALS — BP 162/101 | HR 73 | Resp 16 | Ht 73.5 in | Wt 248.9 lb

## 2022-01-02 DIAGNOSIS — H938X1 Other specified disorders of right ear: Secondary | ICD-10-CM

## 2022-01-02 DIAGNOSIS — H6123 Impacted cerumen, bilateral: Secondary | ICD-10-CM

## 2022-01-02 NOTE — Discharge Instructions (Signed)
Your ears were cleaned out today.  ?The ear drums look normal at this time.  ?Please return if your symptoms return.  ?

## 2022-01-02 NOTE — ED Provider Notes (Signed)
?[facility] ? ? ? ?CSN: [id number] ?Arrival date & time: 01/02/22  1015 ? ? ?  ? ?History   ?Chief Complaint ?Chief Complaint  ?Patient presents with  ? Ear Fullness  ?  Right Ear Fullness and Ear Ringing - Entered by patient  ? Tinnitus  ? ? ?HPI ?Andrew Phillips is a 46 y.o. male.  ? ?Patient is here for ear ringing that started last week Thursday.  He cleaned out the ears without help.  He also has ear fullness on the right.   He is able to feel "fluid" in the ear as well.  He was able to "pop" the ear up until yesterday.  Diminished sounds.  ?His wife called in an abx last week Friday without any help.  Amoxil 529m.  ? ?Past Medical History:  ?Diagnosis Date  ? History of kidney stones   ? HLD (hyperlipidemia)   ? Hypertension   ? NASH (nonalcoholic steatohepatitis)   ? ? ?Patient Active Problem List  ? Diagnosis Date Noted  ? Idiopathic chronic gout of left foot without tophus 02/15/2020  ? ED (erectile dysfunction) of organic origin 01/26/2020  ? Health maintenance examination 03/01/2018  ? History of Lyme disease 09/02/2017  ? Obesity, Class I, BMI 30-34.9 04/22/2017  ? HLD (hyperlipidemia)   ? Essential hypertension   ? NASH (nonalcoholic steatohepatitis)   ? ? ?Past Surgical History:  ?Procedure Laterality Date  ? APPENDECTOMY    ? VASECTOMY  2018  ? WISDOM TOOTH EXTRACTION    ? ? ? ? ? ?Home Medications   ? ?Prior to Admission medications   ?Medication Sig Start Date End Date Taking? Authorizing Provider  ?allopurinol (ZYLOPRIM) 100 MG tablet Take 1 tablet (100 mg total) by mouth daily. 03/21/21   GRia Bush MD  ?amLODipine (NORVASC) 10 MG tablet Take 1 tablet (10 mg total) by mouth daily. 03/21/21   GRia Bush MD  ?colchicine 0.6 MG tablet Take 1 tablet (0.6 mg total) by mouth daily as needed (gout flare). Take 2 tablets on first day of gout flare 03/21/21   GRia Bush MD  ?metoprolol succinate (TOPROL-XL) 50 MG 24 hr tablet Take 1 tablet (50 mg total) by mouth at bedtime.  03/21/21   GRia Bush MD  ?sildenafil (VIAGRA) 100 MG tablet Take 0.5-1 tablets (50-100 mg total) by mouth daily as needed for erectile dysfunction. 11/19/20   GRia Bush MD  ? ? ?Family History ?Family History  ?Problem Relation Age of Onset  ? Hypertension Mother   ? Hypertension Father   ? Hyperlipidemia Father   ? Heart attack Maternal Grandfather 60  ?     smoker  ? Dementia Paternal Grandmother   ? Hypertension Paternal Grandmother   ? ? ?Social History ?Social History  ? ?Tobacco Use  ? Smoking status: Former  ?  Types: Cigarettes  ?  Quit date: 09/07/2006  ?  Years since quitting: 15.3  ? Smokeless tobacco: Former  ?Substance Use Topics  ? Alcohol use: Yes  ?  Comment: occasionally  ? Drug use: No  ? ? ? ?Allergies   ?Lisinopril ? ? ?Review of Systems ?Review of Systems  ?Constitutional: Negative.   ?HENT: Negative.    ?Respiratory: Negative.    ?Cardiovascular: Negative.   ?Gastrointestinal: Negative.   ? ? ?Physical Exam ?Triage Vital Signs ?ED Triage Vitals  ?Enc Vitals Group  ?   BP 01/02/22 1055 (!) 162/101  ?   Pulse Rate 01/02/22 1055 73  ?  Resp 01/02/22 1055 16  ?   Temp --   ?   Temp src --   ?   SpO2 01/02/22 1055 97 %  ?   Weight 01/02/22 1054 248 lb 14.4 oz (112.9 kg)  ?   Height 01/02/22 1054 6' 1.5" (1.867 m)  ?   Head Circumference --   ?   Peak Flow --   ?   Pain Score 01/02/22 1054 0  ?   Pain Loc --   ?   Pain Edu? --   ?   Excl. in [place]? --   ? ?No data found. ? ?Updated Vital Signs ?BP (!) 162/101 (BP Location: Right Arm)   Pulse 73   Resp 16   Ht 6' 1.5" (1.867 m)   Wt 112.9 kg   SpO2 97%   BMI 32.39 kg/m?  ? ?Visual Acuity ?Right Eye Distance:   ?Left Eye Distance:   ?Bilateral Distance:   ? ?Right Eye Near:   ?Left Eye Near:    ?Bilateral Near:    ? ?Physical Exam ?Constitutional:   ?   Appearance: Normal appearance.  ?HENT:  ?   Right Ear: There is impacted cerumen.  ?   Left Ear: There is impacted cerumen.  ?Cardiovascular:  ?   Rate and Rhythm: Normal rate and  regular rhythm.  ?Pulmonary:  ?   Effort: Pulmonary effort is normal.  ?Musculoskeletal:  ?   Cervical back: Normal range of motion and neck supple. No tenderness.  ?Neurological:  ?   Mental Status: He is alert.  ? ? ? ?UC Treatments / Results  ?Labs ?(all labs ordered are listed, but only abnormal results are displayed) ?Labs Reviewed - No data to display ? ?EKG ? ? ?Radiology ?No results found. ? ?Procedures ?Ears were flushed with warm water and peroxide.  Large amount of wax removed.  Tms normal after irrigation.  Patient tolerated well and feeling better.  ? ?Medications Ordered in UC ?Medications - No data to display ? ?Initial Impression / Assessment and Plan / UC Course  ?I have reviewed the triage vital signs and the nursing notes. ? ?Pertinent labs & imaging results that were available during my care of the patient were reviewed by me and considered in my medical decision making (see chart for details). ? ?  ? ?Final Clinical Impressions(s) / UC Diagnoses  ? ?Final diagnoses:  ?Bilateral impacted cerumen  ?Sensation of fullness in right ear  ? ? ? ?Discharge Instructions   ? ?  ?Your ears were cleaned out today.  ?The ear drums look normal at this time.  ?Please return if your symptoms return.  ? ? ? ?ED Prescriptions   ?None ?  ? ?PDMP not reviewed this encounter. ?  Rondel Oh, MD ?01/02/22 1201 ? ?

## 2022-01-02 NOTE — ED Triage Notes (Signed)
Pt reports right ear fullness and ringing.  ?States received an antibiotic last Friday and finished course with no relief.  ?

## 2022-03-16 ENCOUNTER — Other Ambulatory Visit: Payer: 59

## 2022-03-23 ENCOUNTER — Encounter: Payer: 59 | Admitting: Family Medicine

## 2022-04-04 ENCOUNTER — Other Ambulatory Visit: Payer: Self-pay | Admitting: Family Medicine

## 2022-04-06 NOTE — Telephone Encounter (Signed)
E-scribed refill.  Plz schedule CPE and lab visits to prevent delays in future refills.  

## 2022-05-28 ENCOUNTER — Other Ambulatory Visit: Payer: Self-pay | Admitting: Family Medicine

## 2022-05-28 DIAGNOSIS — I10 Essential (primary) hypertension: Secondary | ICD-10-CM

## 2022-07-03 ENCOUNTER — Other Ambulatory Visit: Payer: Self-pay | Admitting: Family Medicine

## 2022-07-03 DIAGNOSIS — N529 Male erectile dysfunction, unspecified: Secondary | ICD-10-CM

## 2022-07-03 NOTE — Telephone Encounter (Signed)
E-scribed refill

## 2022-07-05 ENCOUNTER — Other Ambulatory Visit: Payer: Self-pay | Admitting: Family Medicine

## 2022-07-05 DIAGNOSIS — M1A0720 Idiopathic chronic gout, left ankle and foot, without tophus (tophi): Secondary | ICD-10-CM

## 2022-07-05 DIAGNOSIS — I10 Essential (primary) hypertension: Secondary | ICD-10-CM

## 2022-07-05 DIAGNOSIS — E782 Mixed hyperlipidemia: Secondary | ICD-10-CM

## 2022-07-08 ENCOUNTER — Other Ambulatory Visit (INDEPENDENT_AMBULATORY_CARE_PROVIDER_SITE_OTHER): Payer: 59

## 2022-07-08 DIAGNOSIS — M1A0720 Idiopathic chronic gout, left ankle and foot, without tophus (tophi): Secondary | ICD-10-CM | POA: Diagnosis not present

## 2022-07-08 DIAGNOSIS — I10 Essential (primary) hypertension: Secondary | ICD-10-CM | POA: Diagnosis not present

## 2022-07-08 DIAGNOSIS — E782 Mixed hyperlipidemia: Secondary | ICD-10-CM

## 2022-07-08 LAB — COMPREHENSIVE METABOLIC PANEL
ALT: 55 U/L — ABNORMAL HIGH (ref 0–53)
AST: 34 U/L (ref 0–37)
Albumin: 5.1 g/dL (ref 3.5–5.2)
Alkaline Phosphatase: 76 U/L (ref 39–117)
BUN: 14 mg/dL (ref 6–23)
CO2: 31 mEq/L (ref 19–32)
Calcium: 10 mg/dL (ref 8.4–10.5)
Chloride: 100 mEq/L (ref 96–112)
Creatinine, Ser: 0.86 mg/dL (ref 0.40–1.50)
GFR: 105.08 mL/min (ref >60.00)
Glucose, Bld: 101 mg/dL — ABNORMAL HIGH (ref 70–99)
Potassium: 4.2 mEq/L (ref 3.5–5.1)
Sodium: 138 mEq/L (ref 135–145)
Total Bilirubin: 1.2 mg/dL (ref 0.2–1.2)
Total Protein: 7.8 g/dL (ref 6.0–8.3)

## 2022-07-08 LAB — URIC ACID: Uric Acid, Serum: 7.2 mg/dL (ref 4.0–7.8)

## 2022-07-08 LAB — LIPID PANEL
Cholesterol: 166 mg/dL (ref 0–200)
HDL: 31.1 mg/dL — ABNORMAL LOW (ref >39.00)
LDL Cholesterol: 98 mg/dL (ref 0–99)
NonHDL: 134.9
Total CHOL/HDL Ratio: 5
Triglycerides: 183 mg/dL — ABNORMAL HIGH (ref 0.0–149.0)
VLDL: 36.6 mg/dL (ref 0.0–40.0)

## 2022-07-08 LAB — MICROALBUMIN / CREATININE URINE RATIO
Creatinine,U: 266.7 mg/dL
Microalb Creat Ratio: 2 mg/g (ref 0.0–30.0)
Microalb, Ur: 5.2 mg/dL — ABNORMAL HIGH (ref 0.0–1.9)

## 2022-07-11 ENCOUNTER — Other Ambulatory Visit: Payer: Self-pay | Admitting: Family Medicine

## 2022-07-11 DIAGNOSIS — M1A0720 Idiopathic chronic gout, left ankle and foot, without tophus (tophi): Secondary | ICD-10-CM

## 2022-07-15 ENCOUNTER — Encounter: Payer: Self-pay | Admitting: Family Medicine

## 2022-07-15 ENCOUNTER — Ambulatory Visit (INDEPENDENT_AMBULATORY_CARE_PROVIDER_SITE_OTHER): Payer: 59 | Admitting: Family Medicine

## 2022-07-15 VITALS — BP 134/82 | HR 69 | Temp 97.3°F | Ht 73.0 in | Wt 244.0 lb

## 2022-07-15 DIAGNOSIS — K7581 Nonalcoholic steatohepatitis (NASH): Secondary | ICD-10-CM

## 2022-07-15 DIAGNOSIS — Z23 Encounter for immunization: Secondary | ICD-10-CM | POA: Diagnosis not present

## 2022-07-15 DIAGNOSIS — Z0000 Encounter for general adult medical examination without abnormal findings: Secondary | ICD-10-CM | POA: Diagnosis not present

## 2022-07-15 DIAGNOSIS — M1A0720 Idiopathic chronic gout, left ankle and foot, without tophus (tophi): Secondary | ICD-10-CM

## 2022-07-15 DIAGNOSIS — E782 Mixed hyperlipidemia: Secondary | ICD-10-CM

## 2022-07-15 DIAGNOSIS — N529 Male erectile dysfunction, unspecified: Secondary | ICD-10-CM

## 2022-07-15 DIAGNOSIS — E669 Obesity, unspecified: Secondary | ICD-10-CM

## 2022-07-15 DIAGNOSIS — Z1211 Encounter for screening for malignant neoplasm of colon: Secondary | ICD-10-CM

## 2022-07-15 DIAGNOSIS — D492 Neoplasm of unspecified behavior of bone, soft tissue, and skin: Secondary | ICD-10-CM

## 2022-07-15 DIAGNOSIS — I10 Essential (primary) hypertension: Secondary | ICD-10-CM

## 2022-07-15 MED ORDER — AMLODIPINE BESYLATE 10 MG PO TABS: 10.0000 mg | ORAL_TABLET | Freq: Every day | ORAL | 3 refills | Status: DC

## 2022-07-15 MED ORDER — ALLOPURINOL 100 MG PO TABS: 100.0000 mg | ORAL_TABLET | Freq: Every day | ORAL | 3 refills | Status: DC

## 2022-07-15 MED ORDER — METOPROLOL SUCCINATE ER 50 MG PO TB24: ORAL_TABLET | ORAL | 3 refills | Status: DC

## 2022-07-15 MED ORDER — TADALAFIL 5 MG PO TABS: 5.0000 mg | ORAL_TABLET | Freq: Every day | ORAL | 6 refills | Status: DC

## 2022-07-15 NOTE — Assessment & Plan Note (Signed)
Has been treating as wart with OTC topical salicylic acid without resolution. Will refer to derm for evaluation.

## 2022-07-15 NOTE — Assessment & Plan Note (Addendum)
Viagra effective but struggling with spontaneity.  Desires trial daily medication - daily cialis 4m sent to pharmacy.

## 2022-07-15 NOTE — Progress Notes (Signed)
Patient ID: Andrew Phillips, male    DOB: 08-30-1976, 46 y.o.   MRN: [id number]  This visit was conducted in person.  BP 134/82   Pulse 69   Temp (!) 97.3 F (36.3 C) (Temporal)   Ht 6' 1"  (1.854 m)   Wt 244 lb (110.7 kg)   SpO2 99%   BMI 32.19 kg/m    CC: CPE Subjective:   HPI: Andrew Phillips is a 46 y.o. male presenting on 07/15/2022 for Annual Exam (Pt wants to discuss Viagra. )   Gout - manages with PRN colchicine, last flare 2 wks ago. Avoiding thiazides. Gest on average 2 flares a year.   ED - ongoing difficulty maintaining erection. Viagra 146m 1 tab effective however he notes difficulty with spontaneity with this. Interested in daily cialis trial.   Preventative: Colon cancer screening - no fmhx Flu shot - today COVID vaccine - [place], 12/2019, booster 09/2020 Tdap 2012, rpt today Shingrix - not due Seat belt use discussed Sunscreen use discussed, no changing moles on skin.  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 Interim medical history since our last visit reviewed. Allergies and medications reviewed and updated. Outpatient Medications Prior to Visit  Medication Sig Dispense Refill   colchicine 0.6 MG tablet Take 1 tablet (0.6 mg total) by mouth daily as needed (gout flare). Take 2 tablets on first day of gout flare 30 tablet 3   allopurinol (ZYLOPRIM) 100 MG tablet TAKE 1 TABLET BY MOUTH EVERY DAY 90 tablet 0   amLODipine (NORVASC) 10 MG tablet TAKE 1 TABLET BY MOUTH EVERY DAY 90 tablet 0   metoprolol succinate (TOPROL-XL) 50 MG 24 hr tablet TAKE 1 TABLET BY MOUTH EVERYDAY AT BEDTIME 90 tablet 0    sildenafil (VIAGRA) 100 MG tablet TAKE 0.5-1 TABLETS BY MOUTH DAILY AS NEEDED FOR ERECTILE DYSFUNCTION. 6 tablet 2   No facility-administered medications prior to visit.     Per HPI unless specifically indicated in ROS section below Review of Systems  Constitutional:  Negative for activity change, appetite change, chills, fatigue, fever and unexpected weight change.  HENT:  Negative for hearing loss.   Eyes:  Negative for visual disturbance.  Respiratory:  Negative for cough, chest tightness, shortness of breath and wheezing.   Cardiovascular:  Negative for chest pain, palpitations and leg swelling.  Gastrointestinal:  Negative for abdominal distention, abdominal pain, blood in stool, constipation, diarrhea, nausea and vomiting.  Genitourinary:  Negative for difficulty urinating and hematuria.  Musculoskeletal:  Negative for arthralgias, myalgias and neck pain.  Skin:  Negative for rash.  Neurological:  Negative for dizziness, seizures, syncope and headaches.  Hematological:  Negative for adenopathy. Does not bruise/bleed easily.  Psychiatric/Behavioral:  Negative for dysphoric mood. The patient is not nervous/anxious.     Objective:  BP 134/82   Pulse 69   Temp (!) 97.3 F (36.3 C) (Temporal)   Ht 6' 1"  (1.854 m)   Wt 244 lb (110.7 kg)   SpO2 99%   BMI 32.19 kg/m   Wt Readings from Last 3 Encounters:  07/15/22 244 lb (110.7 kg)  01/02/22 248 lb 14.4 oz (112.9 kg)  11/07/21 249 lb (112.9 kg)      Physical Exam Vitals and nursing note reviewed.  Constitutional:      General: He is not in acute distress.    Appearance: Normal appearance. He is well-developed. He is not ill-appearing.  HENT:     Head: Normocephalic and atraumatic.     Right Ear: Hearing, tympanic membrane, ear canal and external ear normal.     Left Ear: Hearing, tympanic membrane, ear canal and external ear normal.  Eyes:     General: No scleral icterus.    Extraocular Movements: Extraocular movements  intact.     Conjunctiva/sclera: Conjunctivae normal.     Pupils: Pupils are equal, round, and reactive to light.  Neck:     Thyroid: No thyroid mass or thyromegaly.  Cardiovascular:     Rate and Rhythm: Normal rate and regular rhythm.     Pulses: Normal pulses.          Radial pulses are 2+ on the right side and 2+ on the left side.     Heart sounds: Normal heart sounds. No murmur heard. Pulmonary:     Effort: Pulmonary effort is normal. No respiratory distress.     Breath sounds: Normal breath sounds. No wheezing, rhonchi or rales.  Abdominal:     General: Bowel sounds are normal. There is no distension.     Palpations: Abdomen is soft. There is no mass.     Tenderness: There is no abdominal tenderness. There is no guarding or rebound.     Hernia: No hernia is present.  Musculoskeletal:        General: Normal range of motion.     Cervical back: Normal range of motion and neck supple.     Right lower leg: No edema.     Left lower leg: No edema.  Lymphadenopathy:     Cervical: No cervical adenopathy.  Skin:    General: Skin is warm and dry.     Findings: Lesion present. No rash.          Comments: Small hard growth to right forehead  Neurological:     General: No focal deficit present.     Mental Status: He is alert and oriented to person, place, and time.  Psychiatric:        Mood and Affect: Mood normal.        Behavior: Behavior normal.        Thought Content: Thought content normal.        Judgment: Judgment normal.       Results for orders placed or performed in visit on 07/08/22  Uric acid  Result Value Ref Range   Uric Acid, Serum 7.2 4.0 - 7.8 mg/dL  Comprehensive metabolic panel  Result Value Ref Range   Sodium 138 135 - 145 mEq/L   Potassium 4.2 3.5 - 5.1 mEq/L   Chloride 100 96 - 112 mEq/L   CO2 31 19 - 32 mEq/L   Glucose, Bld 101 (H) 70 - 99 mg/dL   BUN 14 6 - 23 mg/dL   Creatinine, Ser 0.86 0.40 - 1.50 mg/dL   Total Bilirubin 1.2 0.2 - 1.2 mg/dL    Alkaline Phosphatase 76 39 - 117 U/L   AST 34 0 - 37 U/L   ALT 55 (H) 0 - 53 U/L   Total Protein 7.8 6.0 - 8.3 g/dL   Albumin 5.1 3.5 - 5.2 g/dL   GFR  105.08 >60.00 mL/min   Calcium 10.0 8.4 - 10.5 mg/dL  Lipid panel  Result Value Ref Range   Cholesterol 166 0 - 200 mg/dL   Triglycerides 183.0 (H) 0.0 - 149.0 mg/dL   HDL 31.10 (L) >39.00 mg/dL   VLDL 36.6 0.0 - 40.0 mg/dL   LDL Cholesterol 98 0 - 99 mg/dL   Total CHOL/HDL Ratio 5    NonHDL 134.90   Microalbumin / creatinine urine ratio  Result Value Ref Range   Microalb, Ur 5.2 (H) 0.0 - 1.9 mg/dL   Creatinine,U 266.7 mg/dL   Microalb Creat Ratio 2.0 0.0 - 30.0 mg/g   Lab Results  Component Value Date   WBC 6.3 03/01/2018   HGB 14.4 03/01/2018   HCT 41.7 03/01/2018   MCV 85.5 03/01/2018   PLT 178.0 03/01/2018    Assessment & Plan:   Problem List Items Addressed This Visit     Health maintenance examination - Primary (Chronic)    Preventative protocols reviewed and updated unless pt declined. Discussed healthy diet and lifestyle.       HLD (hyperlipidemia)    Reviewed diet choices to improve triglycerides.  The 10-year ASCVD risk score (Arnett DK, et al., 2019) is: 3%   Values used to calculate the score:     Age: 24 years     Sex: Male     Is Non-Hispanic African American: No     Diabetic: No     Tobacco smoker: No     Systolic Blood Pressure: 641 mmHg     Is BP treated: Yes     HDL Cholesterol: 31.1 mg/dL     Total Cholesterol: 166 mg/dL       Relevant Medications   tadalafil (CIALIS) 5 MG tablet   amLODipine (NORVASC) 10 MG tablet   metoprolol succinate (TOPROL-XL) 50 MG 24 hr tablet   Essential hypertension    Chronic, stable. Continue current regimen.       Relevant Medications   tadalafil (CIALIS) 5 MG tablet   amLODipine (NORVASC) 10 MG tablet   metoprolol succinate (TOPROL-XL) 50 MG 24 hr tablet   NASH (nonalcoholic steatohepatitis)    Improved with weight loss. Will continue to monitor.   Check plt next labwork for Fib4 score.       Obesity, Class I, BMI 30-34.9    Continue to encourage healthy diet and lifestyle choices for sustainable weight loss       ED (erectile dysfunction) of organic origin    Viagra effective but struggling with spontaneity.  Desires trial daily medication - daily cialis 20m sent to pharmacy.       Idiopathic chronic gout of left foot without tophus    Stable period without gout flare on low dose allopurinol - continue.       Relevant Medications   allopurinol (ZYLOPRIM) 100 MG tablet   Skin growth    Has been treating as wart with OTC topical salicylic acid without resolution. Will refer to derm for evaluation.       Relevant Orders   Ambulatory referral to Dermatology   Other Visit Diagnoses     Need for influenza vaccination       Relevant Orders   Flu Vaccine QUAD 67moM (Fluarix, Fluzone & Alfiuria Quad PF) (Completed)   Special screening for malignant neoplasms, colon       Relevant Orders   Ambulatory referral to Gastroenterology   Need for Tdap vaccination       Relevant Orders  Tdap vaccine greater than or equal to 7yo IM (Completed)        Meds ordered this encounter  Medications   tadalafil (CIALIS) 5 MG tablet    Sig: Take 1 tablet (5 mg total) by mouth daily.    Dispense:  30 tablet    Refill:  6    In place of viagra   allopurinol (ZYLOPRIM) 100 MG tablet    Sig: Take 1 tablet (100 mg total) by mouth daily.    Dispense:  90 tablet    Refill:  3   amLODipine (NORVASC) 10 MG tablet    Sig: Take 1 tablet (10 mg total) by mouth daily.    Dispense:  90 tablet    Refill:  3   metoprolol succinate (TOPROL-XL) 50 MG 24 hr tablet    Sig: TAKE 1 TABLET BY MOUTH EVERYDAY AT BEDTIME    Dispense:  90 tablet    Refill:  3   Orders Placed This Encounter  Procedures   Flu Vaccine QUAD 50moIM (Fluarix, Fluzone & Alfiuria Quad PF)   Tdap vaccine greater than or equal to 7yo IM   Ambulatory referral to  Gastroenterology    Referral Priority:   Routine    Referral Type:   Consultation    Referral Reason:   Specialty Services Required    Number of Visits Requested:   1   Ambulatory referral to Dermatology    Referral Priority:   Routine    Referral Type:   Consultation    Referral Reason:   Specialty Services Required    Requested Specialty:   Dermatology    Number of Visits Requested:   1    Patient instructions: Flu shot today Tdap today  Try cialis 588mdaily - sent to pharmacy. We will refer you to skin doctor .  Good to see you today Return as needed or in 1 year for next physical.   Follow up plan: Return in about 1 year (around 07/16/2023) for annual exam, prior fasting for blood work.  JaRia BushMD

## 2022-07-15 NOTE — Assessment & Plan Note (Addendum)
Improved with weight loss. Will continue to monitor.  Check plt next labwork for Fib4 score.

## 2022-07-15 NOTE — Assessment & Plan Note (Signed)
Continue to encourage healthy diet and lifestyle choices for sustainable weight loss

## 2022-07-15 NOTE — Assessment & Plan Note (Signed)
Stable period without gout flare on low dose allopurinol - continue.

## 2022-07-15 NOTE — Patient Instructions (Addendum)
Flu shot today Tdap today  Try cialis 53m daily - sent to pharmacy. We will refer you to skin doctor .  Good to see you today Return as needed or in 1 year for next physical.   Health Maintenance, Male Adopting a healthy lifestyle and getting preventive care are important in promoting health and wellness. Ask your health care provider about: The right schedule for you to have regular tests and exams. Things you can do on your own to prevent diseases and keep yourself healthy. What should I know about diet, weight, and exercise? Eat a healthy diet  Eat a diet that includes plenty of vegetables, fruits, low-fat dairy products, and lean protein. Do not eat a lot of foods that are high in solid fats, added sugars, or sodium. Maintain a healthy weight Body mass index (BMI) is a measurement that can be used to identify possible weight problems. It estimates body fat based on height and weight. Your health care provider can help determine your BMI and help you achieve or maintain a healthy weight. Get regular exercise Get regular exercise. This is one of the most important things you can do for your health. Most adults should: Exercise for at least 150 minutes each week. The exercise should increase your heart rate and make you sweat (moderate-intensity exercise). Do strengthening exercises at least twice a week. This is in addition to the moderate-intensity exercise. Spend less time sitting. Even light physical activity can be beneficial. Watch cholesterol and blood lipids Have your blood tested for lipids and cholesterol at 44years of age, then have this test every 5 years. You may need to have your cholesterol levels checked more often if: Your lipid or cholesterol levels are high. You are older than 44years of age. You are at high risk for heart disease. What should I know about cancer screening? Many types of cancers can be detected early and may often be prevented. Depending on your  health history and family history, you may need to have cancer screening at various ages. This may include screening for: Colorectal cancer. Prostate cancer. Skin cancer. Lung cancer. What should I know about heart disease, diabetes, and high blood pressure? Blood pressure and heart disease High blood pressure causes heart disease and increases the risk of stroke. This is more likely to develop in people who have high blood pressure readings or are overweight. Talk with your health care provider about your target blood pressure readings. Have your blood pressure checked: Every 3-5 years if you are 162363years of age. Every year if you are 436years old or older. If you are between the ages of 666and 752and are a current or former smoker, ask your health care provider if you should have a one-time screening for abdominal aortic aneurysm (AAA). Diabetes Have regular diabetes screenings. This checks your fasting blood sugar level. Have the screening done: Once every three years after age 2350if you are at a normal weight and have a low risk for diabetes. More often and at a younger age if you are overweight or have a high risk for diabetes. What should I know about preventing infection? Hepatitis B If you have a higher risk for hepatitis B, you should be screened for this virus. Talk with your health care provider to find out if you are at risk for hepatitis B infection. Hepatitis C Blood testing is recommended for: Everyone born from [birth year]through 1963. Anyone with known risk factors for hepatitis  C. Sexually transmitted infections (STIs) You should be screened each year for STIs, including gonorrhea and chlamydia, if: You are sexually active and are younger than 44 years of age. You are older than 44 years of age and your health care provider tells you that you are at risk for this type of infection. Your sexual activity has changed since you were last screened, and you are at increased risk  for chlamydia or gonorrhea. Ask your health care provider if you are at risk. Ask your health care provider about whether you are at high risk for HIV. Your health care provider may recommend a prescription medicine to help prevent HIV infection. If you choose to take medicine to prevent HIV, you should first get tested for HIV. You should then be tested every 3 months for as long as you are taking the medicine. Follow these instructions at home: Alcohol use Do not drink alcohol if your health care provider tells you not to drink. If you drink alcohol: Limit how much you have to 0-2 drinks a day. Know how much alcohol is in your drink. In the [place] one drink equals one 12 oz bottle of beer (355 mL), one 5 oz glass of wine (148 mL), or one 1 oz glass of hard liquor (44 mL). Lifestyle Do not use any products that contain nicotine or tobacco. These products include cigarettes, chewing tobacco, and vaping devices, such as e-cigarettes. If you need help quitting, ask your health care provider. Do not use street drugs. Do not share needles. Ask your health care provider for help if you need support or information about quitting drugs. General instructions Schedule regular health, dental, and eye exams. Stay current with your vaccines. Tell your health care provider if: You often feel depressed. You have ever been abused or do not feel safe at home. Summary Adopting a healthy lifestyle and getting preventive care are important in promoting health and wellness. Follow your health care provider's instructions about healthy diet, exercising, and getting tested or screened for diseases. Follow your health care provider's instructions on monitoring your cholesterol and blood pressure. This information is not intended to replace advice given to you by your health care provider. Make sure you discuss any questions you have with your health care provider. Document Revised: 01/13/2021 Document Reviewed:  01/13/2021 Elsevier Patient Education  [place].

## 2022-07-15 NOTE — Assessment & Plan Note (Signed)
Preventative protocols reviewed and updated unless pt declined. Discussed healthy diet and lifestyle.  

## 2022-07-15 NOTE — Assessment & Plan Note (Signed)
Chronic, stable. Continue current regimen. 

## 2022-07-15 NOTE — Assessment & Plan Note (Signed)
Reviewed diet choices to improve triglycerides.  The 10-year ASCVD risk score (Arnett DK, et al., 2019) is: 3%   Values used to calculate the score:     Age: 46 years     Sex: Male     Is Non-Hispanic African American: No     Diabetic: No     Tobacco smoker: No     Systolic Blood Pressure: 432 mmHg     Is BP treated: Yes     HDL Cholesterol: 31.1 mg/dL     Total Cholesterol: 166 mg/dL

## 2022-07-16 ENCOUNTER — Encounter: Payer: Self-pay | Admitting: Gastroenterology

## 2022-08-19 ENCOUNTER — Ambulatory Visit (AMBULATORY_SURGERY_CENTER): Payer: 59

## 2022-08-19 VITALS — Ht 73.0 in | Wt 249.0 lb

## 2022-08-19 DIAGNOSIS — Z1211 Encounter for screening for malignant neoplasm of colon: Secondary | ICD-10-CM

## 2022-08-19 MED ORDER — NA SULFATE-K SULFATE-MG SULF 17.5-3.13-1.6 GM/177ML PO SOLN: 1.0000 | Freq: Once | ORAL | 0 refills | Status: AC

## 2022-08-19 NOTE — Progress Notes (Signed)
No egg or soy allergy known to patient;  No issues known to pt with past sedation with any surgeries or procedures; Patient denies ever being told they had issues or difficulty with intubation;  No FH of Malignant Hyperthermia; Pt is not on diet pills; Pt is not on home 02;  Pt is not on blood thinners;  Pt denies issues with constipation;  No A fib or A flutter; Have any cardiac testing pending--NO Pt instructed to use Singlecare.com or GoodRx for a price reduction on prep;  Insurance verified during [place] appt=[employer]  Patient's chart reviewed by Osvaldo Angst CNRA prior to previsit and patient appropriate for the [place].  Previsit completed and red dot placed by patient's name on their procedure day (on provider's schedule).    GoodRx coupon given to patient during [facility] appt;

## 2022-09-11 ENCOUNTER — Encounter: Payer: 59 | Admitting: Gastroenterology

## 2022-09-15 ENCOUNTER — Encounter: Payer: Self-pay | Admitting: Gastroenterology

## 2022-09-15 ENCOUNTER — Encounter: Payer: Self-pay | Admitting: Certified Registered Nurse Anesthetist

## 2022-09-18 ENCOUNTER — Encounter: Payer: Self-pay | Admitting: Gastroenterology

## 2022-09-18 ENCOUNTER — Ambulatory Visit (AMBULATORY_SURGERY_CENTER): Payer: 59 | Admitting: Gastroenterology

## 2022-09-18 VITALS — BP 127/84 | HR 69 | Temp 98.2°F | Resp 16 | Ht 73.0 in | Wt 249.0 lb

## 2022-09-18 DIAGNOSIS — Z1211 Encounter for screening for malignant neoplasm of colon: Secondary | ICD-10-CM | POA: Diagnosis present

## 2022-09-18 DIAGNOSIS — D120 Benign neoplasm of cecum: Secondary | ICD-10-CM | POA: Diagnosis not present

## 2022-09-18 DIAGNOSIS — D122 Benign neoplasm of ascending colon: Secondary | ICD-10-CM

## 2022-09-18 DIAGNOSIS — D123 Benign neoplasm of transverse colon: Secondary | ICD-10-CM

## 2022-09-18 DIAGNOSIS — D127 Benign neoplasm of rectosigmoid junction: Secondary | ICD-10-CM | POA: Diagnosis not present

## 2022-09-18 MED ORDER — SODIUM CHLORIDE 0.9 % IV SOLN: 500.0000 mL | Freq: Once | INTRAVENOUS | Status: DC

## 2022-09-18 NOTE — Patient Instructions (Signed)
Await pathology results.  Handouts on hemorrhoids and polyps provided.  YOU HAD AN ENDOSCOPIC PROCEDURE TODAY AT [facility]:   Refer to the procedure report that was given to you for any specific questions about what was found during the examination.  If the procedure report does not answer your questions, please call your gastroenterologist to clarify.  If you requested that your care partner not be given the details of your procedure findings, then the procedure report has been included in a sealed envelope for you to review at your convenience later.  YOU SHOULD EXPECT: Some feelings of bloating in the abdomen. Passage of more gas than usual.  Walking can help get rid of the air that was put into your GI tract during the procedure and reduce the bloating. If you had a lower endoscopy (such as a colonoscopy or flexible sigmoidoscopy) you may notice spotting of blood in your stool or on the toilet paper. If you underwent a bowel prep for your procedure, you may not have a normal bowel movement for a few days.  Please Note:  You might notice some irritation and congestion in your nose or some drainage.  This is from the oxygen used during your procedure.  There is no need for concern and it should clear up in a day or so.  SYMPTOMS TO REPORT IMMEDIATELY:  Following lower endoscopy (colonoscopy or flexible sigmoidoscopy):  Excessive amounts of blood in the stool  Significant tenderness or worsening of abdominal pains  Swelling of the abdomen that is new, acute  Fever of 100F or higher  For urgent or emergent issues, a gastroenterologist can be reached at any hour by calling [phone]. Do not use MyChart messaging for urgent concerns.    DIET:  We do recommend a small meal at first, but then you may proceed to your regular diet.  Drink plenty of fluids but you should avoid alcoholic beverages for 24 hours.  ACTIVITY:  You should plan to take it easy for the rest of today  and you should NOT DRIVE or use heavy machinery until tomorrow (because of the sedation medicines used during the test).    FOLLOW UP: Our staff will call the number listed on your records the next business day following your procedure.  We will call around 7:15- 8:00 am to check on you and address any questions or concerns that you may have regarding the information given to you following your procedure. If we do not reach you, we will leave a message.     If any biopsies were taken you will be contacted by phone or by letter within the next 1-3 weeks.  Please call us at [phone] if you have not heard about the biopsies in 3 weeks.    SIGNATURES/CONFIDENTIALITY: You and/or your care partner have signed paperwork which will be entered into your electronic medical record.  These signatures attest to the fact that that the information above on your After Visit Summary has been reviewed and is understood.  Full responsibility of the confidentiality of this discharge information lies with you and/or your care-partner.

## 2022-09-18 NOTE — Progress Notes (Signed)
Pt's states no medical or surgical changes since previsit or office visit. 

## 2022-09-18 NOTE — Progress Notes (Signed)
[facility] History and Physical   Primary Care Physician:  Andrew Bush, MD   Reason for Procedure:   Colon cancer screening  Plan:    colonoscopy     HPI: Andrew Phillips is a 47 y.o. male  here for colonoscopy screening - first time exam.   Patient denies any bowel symptoms at this time. No family history of colon cancer known. Andrew Phillips did have polyps. Otherwise feels well without any cardiopulmonary symptoms.   I have discussed risks / benefits of anesthesia and endoscopic procedure with Andrew Phillips and they wish to proceed with the exams as outlined today.    Past Medical History:  Diagnosis Date   History of kidney stones    Hypertension    on meds   NASH (nonalcoholic steatohepatitis)     Past Surgical History:  Procedure Laterality Date   APPENDECTOMY  1995   VASECTOMY  2018   Andrew Phillips    Prior to Admission medications   Medication Sig Start Date End Date Taking? Authorizing Provider  allopurinol (ZYLOPRIM) 100 MG tablet Take 1 tablet (100 mg total) by mouth daily. 07/15/22  Yes Andrew Bush, MD  amLODipine (NORVASC) 10 MG tablet Take 1 tablet (10 mg total) by mouth daily. 07/15/22  Yes Andrew Bush, MD  metoprolol succinate (TOPROL-XL) 50 MG 24 hr tablet TAKE 1 TABLET BY MOUTH EVERYDAY AT BEDTIME 07/15/22  Yes Andrew Bush, MD  Multiple Vitamins-Minerals (CENTRUM ULTRA MENS PO) Take 1 tablet by mouth daily at 6 (six) AM.   Yes [provider]  tadalafil (CIALIS) 5 MG tablet Take 1 tablet (5 mg total) by mouth daily. 07/15/22  Yes Andrew Bush, MD  colchicine 0.6 MG tablet Take 1 tablet (0.6 mg total) by mouth daily as needed (gout flare). Take 2 tablets on first day of gout flare 03/21/21   Andrew Bush, MD    Current Outpatient Medications  Medication Sig Dispense Refill   allopurinol (ZYLOPRIM) 100 MG tablet Take 1 tablet (100 mg total) by mouth daily. 90 tablet 3   amLODipine (NORVASC) 10 MG  tablet Take 1 tablet (10 mg total) by mouth daily. 90 tablet 3   metoprolol succinate (TOPROL-XL) 50 MG 24 hr tablet TAKE 1 TABLET BY MOUTH EVERYDAY AT BEDTIME 90 tablet 3   Multiple Vitamins-Minerals (CENTRUM ULTRA MENS PO) Take 1 tablet by mouth daily at 6 (six) AM.     tadalafil (CIALIS) 5 MG tablet Take 1 tablet (5 mg total) by mouth daily. 30 tablet 6   colchicine 0.6 MG tablet Take 1 tablet (0.6 mg total) by mouth daily as needed (gout flare). Take 2 tablets on first day of gout flare 30 tablet 3   Current Facility-Administered Medications  Medication Dose Route Frequency Provider Last Rate Last Admin   0.9 %  sodium chloride infusion  500 mL Intravenous Once Andrew Phillips, Andrew Raspberry, MD        Allergies as of 09/18/2022 - Review Complete 09/18/2022  Allergen Reaction Noted   Lisinopril Swelling 04/22/2017    Family History  Problem Relation Age of Onset   Colon polyps Andrew Phillips 87   Hypertension Andrew Phillips    Hypertension Father    Hyperlipidemia Father    Heart attack Maternal Grandfather 66       smoker   Dementia Paternal Grandmother    Hypertension Paternal Grandmother    Colon cancer Neg Hx    Esophageal cancer Neg Hx    Stomach cancer Neg Hx  Rectal cancer Neg Hx     Social History   Socioeconomic History   Marital status: Married    Spouse name: Not on file   Number of children: Not on file   Years of education: Not on file   Highest education level: Not on file  Occupational History   Not on file  Tobacco Use   Smoking status: Former    Types: Cigarettes    Quit date: 09/07/2006    Years since quitting: 16.0   Smokeless tobacco: Former  Scientific laboratory technician Use: Never used  Substance and Sexual Activity   Alcohol use: Yes    Comment: 2-3 per month   Drug use: No   Sexual activity: Yes  Other Topics Concern   Not on file  Social History Narrative   Lives with wife and 3 boys    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  Physical Exam: Vital signs BP (!) 147/99   Pulse 70   Temp 98.2 F (36.8 C)   Ht '6\' 1"'$  (1.854 m)   Wt 249 lb (112.9 kg)   SpO2 99%   BMI 32.85 kg/m   General:   Alert,  Well-developed, pleasant and cooperative in NAD Lungs:  Clear throughout to auscultation.   Heart:  Regular rate and rhythm Abdomen:  Soft, nontender and nondistended.   Neuro/Psych:  Alert and cooperative. Normal mood and affect. A and O x 3  Andrew Mango, MD [facility]

## 2022-09-18 NOTE — Progress Notes (Signed)
Report given to PACU, vss 

## 2022-09-18 NOTE — Op Note (Signed)
[place] [facility] Patient Name: Andrew Phillips Procedure Date: 09/18/2022 9:25 AM MRN: [id number] Endoscopist: Remo Lipps P. Havery Moros , MD, [id number] Age: 47 Referring MD:  Date of Birth: 1976/06/25 Gender: Male Account #: [id number] Procedure:                Colonoscopy Indications:              Screening for colorectal malignant neoplasm, This                            is the patient's first colonoscopy Medicines:                Monitored Anesthesia Care Procedure:                Pre-Anesthesia Assessment:                           - Prior to the procedure, a History and Physical                            was performed, and patient medications and                            allergies were reviewed. The patient's tolerance of                            previous anesthesia was also reviewed. The risks                            and benefits of the procedure and the sedation                            options and risks were discussed with the patient.                            All questions were answered, and informed consent                            was obtained. Prior Anticoagulants: The patient has                            taken no anticoagulant or antiplatelet agents. ASA                            Grade Assessment: II - A patient with mild systemic                            disease. After reviewing the risks and benefits,                            the patient was deemed in satisfactory condition to                            undergo the procedure.  After obtaining informed consent, the colonoscope                            was passed under direct vision. Throughout the                            procedure, the patient's blood pressure, pulse, and                            oxygen saturations were monitored continuously. The                            Olympus CF-HQ190L ([id number]) Colonoscope was                            introduced through the anus  and advanced to the the                            cecum, identified by appendiceal orifice and                            ileocecal valve. The colonoscopy was performed                            without difficulty. The patient tolerated the                            procedure well. The quality of the bowel                            preparation was good. The ileocecal valve,                            appendiceal orifice, and rectum were photographed. Scope In: 9:28:54 AM Scope Out: 9:47:24 AM Scope Withdrawal Time: 0 hours 16 minutes 31 seconds  Total Procedure Duration: 0 hours 18 minutes 30 seconds  Findings:                 The perianal and digital rectal examinations were                            normal.                           An 8 mm polyp was found in the cecum. The polyp was                            sessile. The polyp was removed with a cold snare.                            Resection and retrieval were complete.                           A 5 mm polyp was found in the ascending colon. The  polyp was sessile. The polyp was removed with a                            cold snare. Resection and retrieval were complete.                           Three sessile polyps were found in the transverse                            colon. The polyps were 4 to 6 mm in size. These                            polyps were removed with a cold snare. Resection                            and retrieval were complete.                           A 3 mm polyp was found in the recto-sigmoid colon.                            The polyp was sessile. The polyp was removed with a                            cold snare. Resection and retrieval were complete.                           Internal hemorrhoids were found during retroflexion.                           The exam was otherwise without abnormality. Complications:            No immediate complications. Estimated blood loss:                             Minimal. Estimated Blood Loss:     Estimated blood loss was minimal. Impression:               - One 8 mm polyp in the cecum, removed with a cold                            snare. Resected and retrieved.                           - One 5 mm polyp in the ascending colon, removed                            with a cold snare. Resected and retrieved.                           - Three 4 to 6 mm polyps in the transverse colon,  removed with a cold snare. Resected and retrieved.                           - One 3 mm polyp at the recto-sigmoid colon,                            removed with a cold snare. Resected and retrieved.                           - Internal hemorrhoids.                           - The examination was otherwise normal. Recommendation:           - Patient has a contact number available for                            emergencies. The signs and symptoms of potential                            delayed complications were discussed with the                            patient. Return to normal activities tomorrow.                            Written discharge instructions were provided to the                            patient.                           - Resume previous diet.                           - Continue present medications.                           - Await pathology results. Remo Lipps P. Zarin Knupp, MD 09/18/2022 9:52:37 AM This report has been signed electronically.

## 2022-09-18 NOTE — Progress Notes (Signed)
Called to room to assist during endoscopic procedure.  Patient ID and intended procedure confirmed with present staff. Received instructions for my participation in the procedure from the performing physician.  

## 2022-09-21 ENCOUNTER — Telehealth: Payer: Self-pay

## 2022-09-21 NOTE — Telephone Encounter (Signed)
Left message on answering machine. 

## 2022-09-30 ENCOUNTER — Encounter: Payer: 59 | Admitting: Gastroenterology

## 2022-10-05 ENCOUNTER — Encounter: Payer: Self-pay | Admitting: Family Medicine

## 2022-10-05 DIAGNOSIS — R6882 Decreased libido: Secondary | ICD-10-CM

## 2022-10-06 NOTE — Addendum Note (Signed)
Addended by: Ria Bush on: 10/06/2022 09:06 AM   Modules accepted: Orders

## 2022-10-07 ENCOUNTER — Other Ambulatory Visit: Payer: Self-pay | Admitting: Family Medicine

## 2022-10-07 ENCOUNTER — Other Ambulatory Visit (INDEPENDENT_AMBULATORY_CARE_PROVIDER_SITE_OTHER): Payer: 59

## 2022-10-07 DIAGNOSIS — R6882 Decreased libido: Secondary | ICD-10-CM

## 2022-10-07 DIAGNOSIS — R7989 Other specified abnormal findings of blood chemistry: Secondary | ICD-10-CM | POA: Insufficient documentation

## 2022-10-07 LAB — TESTOSTERONE: Testosterone: 294.74 ng/dL — ABNORMAL LOW (ref 300.00–890.00)

## 2022-10-08 ENCOUNTER — Other Ambulatory Visit (INDEPENDENT_AMBULATORY_CARE_PROVIDER_SITE_OTHER): Payer: 59

## 2022-10-08 DIAGNOSIS — R7989 Other specified abnormal findings of blood chemistry: Secondary | ICD-10-CM | POA: Diagnosis not present

## 2022-10-08 LAB — IBC PANEL
Iron: 73 ug/dL (ref 42–165)
Saturation Ratios: 16.1 % — ABNORMAL LOW (ref 20.0–50.0)
TIBC: 452.2 ug/dL — ABNORMAL HIGH (ref 250.0–450.0)
Transferrin: 323 mg/dL (ref 212.0–360.0)

## 2022-10-08 LAB — T4, FREE: Free T4: 0.89 ng/dL (ref 0.60–1.60)

## 2022-10-08 LAB — LUTEINIZING HORMONE: LH: 4.2 m[IU]/mL (ref 1.50–9.30)

## 2022-10-08 LAB — FOLLICLE STIMULATING HORMONE: FSH: 6.6 m[IU]/mL (ref 1.4–18.1)

## 2022-10-08 LAB — TSH: TSH: 2.31 u[IU]/mL (ref 0.35–5.50)

## 2022-10-09 LAB — PROLACTIN: Prolactin: 4.7 ng/mL (ref 2.0–18.0)

## 2022-10-09 LAB — TESTOSTERONE TOTAL,FREE,BIO, MALES
Albumin: 4.8 g/dL (ref 3.6–5.1)
Sex Hormone Binding: 23 nmol/L (ref 10–50)
Testosterone, Bioavailable: 203.4 ng/dL (ref 110.0–575.0)
Testosterone, Free: 93 pg/mL (ref 46.0–224.0)
Testosterone: 527 ng/dL (ref 250–827)

## 2022-10-09 LAB — CORTISOL-AM, BLOOD: Cortisol - AM: 13.4 ug/dL

## 2023-01-31 ENCOUNTER — Other Ambulatory Visit: Payer: Self-pay | Admitting: Family Medicine

## 2023-01-31 DIAGNOSIS — N529 Male erectile dysfunction, unspecified: Secondary | ICD-10-CM

## 2023-07-11 ENCOUNTER — Other Ambulatory Visit: Payer: Self-pay | Admitting: Family Medicine

## 2023-07-11 DIAGNOSIS — E782 Mixed hyperlipidemia: Secondary | ICD-10-CM

## 2023-07-11 DIAGNOSIS — M1A0720 Idiopathic chronic gout, left ankle and foot, without tophus (tophi): Secondary | ICD-10-CM

## 2023-07-11 DIAGNOSIS — I10 Essential (primary) hypertension: Secondary | ICD-10-CM

## 2023-07-11 NOTE — Addendum Note (Signed)
Addended by: Eustaquio Boyden on: 07/11/2023 09:47 PM   Modules accepted: Orders

## 2023-07-13 ENCOUNTER — Other Ambulatory Visit (INDEPENDENT_AMBULATORY_CARE_PROVIDER_SITE_OTHER): Payer: 59

## 2023-07-13 DIAGNOSIS — E782 Mixed hyperlipidemia: Secondary | ICD-10-CM

## 2023-07-13 DIAGNOSIS — I10 Essential (primary) hypertension: Secondary | ICD-10-CM

## 2023-07-13 DIAGNOSIS — M1A0720 Idiopathic chronic gout, left ankle and foot, without tophus (tophi): Secondary | ICD-10-CM | POA: Diagnosis not present

## 2023-07-13 LAB — CBC WITH DIFFERENTIAL/PLATELET
Basophils Absolute: 0 10*3/uL (ref 0.0–0.1)
Basophils Relative: 0.7 % (ref 0.0–3.0)
Eosinophils Absolute: 0.2 10*3/uL (ref 0.0–0.7)
Eosinophils Relative: 3.6 % (ref 0.0–5.0)
HCT: 43.3 % (ref 39.0–52.0)
Hemoglobin: 14.3 g/dL (ref 13.0–17.0)
Lymphocytes Relative: 29.7 % (ref 12.0–46.0)
Lymphs Abs: 1.5 10*3/uL (ref 0.7–4.0)
MCHC: 33.1 g/dL (ref 30.0–36.0)
MCV: 89.5 fL (ref 78.0–100.0)
Monocytes Absolute: 0.4 10*3/uL (ref 0.1–1.0)
Monocytes Relative: 7.4 % (ref 3.0–12.0)
Neutro Abs: 3 10*3/uL (ref 1.4–7.7)
Neutrophils Relative %: 58.6 % (ref 43.0–77.0)
Platelets: 191 10*3/uL (ref 150.0–400.0)
RBC: 4.83 Mil/uL (ref 4.22–5.81)
RDW: 13.2 % (ref 11.5–15.5)
WBC: 5.2 10*3/uL (ref 4.0–10.5)

## 2023-07-13 LAB — COMPREHENSIVE METABOLIC PANEL
ALT: 62 U/L — ABNORMAL HIGH (ref 0–53)
AST: 35 U/L (ref 0–37)
Albumin: 4.8 g/dL (ref 3.5–5.2)
Alkaline Phosphatase: 65 U/L (ref 39–117)
BUN: 12 mg/dL (ref 6–23)
CO2: 29 meq/L (ref 19–32)
Calcium: 9.4 mg/dL (ref 8.4–10.5)
Chloride: 100 meq/L (ref 96–112)
Creatinine, Ser: 0.97 mg/dL (ref 0.40–1.50)
GFR: 94.07 mL/min (ref >60.00)
Glucose, Bld: 111 mg/dL — ABNORMAL HIGH (ref 70–99)
Potassium: 4.3 meq/L (ref 3.5–5.1)
Sodium: 138 meq/L (ref 135–145)
Total Bilirubin: 0.8 mg/dL (ref 0.2–1.2)
Total Protein: 7.6 g/dL (ref 6.0–8.3)

## 2023-07-13 LAB — LIPID PANEL
Cholesterol: 182 mg/dL (ref 0–200)
HDL: 30.6 mg/dL — ABNORMAL LOW (ref >39.00)
LDL Cholesterol: 96 mg/dL (ref 0–99)
NonHDL: 150.95
Total CHOL/HDL Ratio: 6
Triglycerides: 273 mg/dL — ABNORMAL HIGH (ref 0.0–149.0)
VLDL: 54.6 mg/dL — ABNORMAL HIGH (ref 0.0–40.0)

## 2023-07-13 LAB — MICROALBUMIN / CREATININE URINE RATIO
Creatinine,U: 98.6 mg/dL
Microalb Creat Ratio: 2.4 mg/g (ref 0.0–30.0)
Microalb, Ur: 2.3 mg/dL — ABNORMAL HIGH (ref 0.0–1.9)

## 2023-07-13 LAB — URIC ACID: Uric Acid, Serum: 6.8 mg/dL (ref 4.0–7.8)

## 2023-07-20 ENCOUNTER — Encounter: Payer: Self-pay | Admitting: Family Medicine

## 2023-07-20 ENCOUNTER — Ambulatory Visit: Payer: 59 | Admitting: Family Medicine

## 2023-07-20 VITALS — BP 110/76 | HR 76 | Temp 98.6°F | Ht 73.5 in | Wt 255.0 lb

## 2023-07-20 DIAGNOSIS — M1A0720 Idiopathic chronic gout, left ankle and foot, without tophus (tophi): Secondary | ICD-10-CM | POA: Diagnosis not present

## 2023-07-20 DIAGNOSIS — Z23 Encounter for immunization: Secondary | ICD-10-CM

## 2023-07-20 DIAGNOSIS — Z0000 Encounter for general adult medical examination without abnormal findings: Secondary | ICD-10-CM

## 2023-07-20 DIAGNOSIS — I10 Essential (primary) hypertension: Secondary | ICD-10-CM | POA: Diagnosis not present

## 2023-07-20 DIAGNOSIS — E66811 Obesity, class 1: Secondary | ICD-10-CM

## 2023-07-20 DIAGNOSIS — E782 Mixed hyperlipidemia: Secondary | ICD-10-CM

## 2023-07-20 DIAGNOSIS — K7581 Nonalcoholic steatohepatitis (NASH): Secondary | ICD-10-CM

## 2023-07-20 DIAGNOSIS — N529 Male erectile dysfunction, unspecified: Secondary | ICD-10-CM

## 2023-07-20 MED ORDER — ALLOPURINOL 100 MG PO TABS: 100.0000 mg | ORAL_TABLET | Freq: Every day | ORAL | 4 refills | Status: DC

## 2023-07-20 MED ORDER — METOPROLOL SUCCINATE ER 50 MG PO TB24: ORAL_TABLET | ORAL | 4 refills | Status: DC

## 2023-07-20 MED ORDER — TADALAFIL 5 MG PO TABS: 5.0000 mg | ORAL_TABLET | Freq: Every day | ORAL | 4 refills | Status: DC

## 2023-07-20 MED ORDER — COLCHICINE 0.6 MG PO TABS: 0.6000 mg | ORAL_TABLET | Freq: Every day | ORAL | 3 refills | Status: AC | PRN

## 2023-07-20 MED ORDER — AMLODIPINE BESYLATE 10 MG PO TABS: 10.0000 mg | ORAL_TABLET | Freq: Every day | ORAL | 4 refills | Status: DC

## 2023-07-20 NOTE — Patient Instructions (Addendum)
Flu shot today  Forms for work filled out today  Good to see you today Return as needed or in 1 year for next physical.

## 2023-07-20 NOTE — Assessment & Plan Note (Signed)
Encourage healthy diet and lifestyle choices to affect sustainable weight loss.  

## 2023-07-20 NOTE — Assessment & Plan Note (Signed)
Preventative protocols reviewed and updated unless pt declined. Discussed healthy diet and lifestyle.  

## 2023-07-20 NOTE — Assessment & Plan Note (Signed)
Chronic, deteriorated triglyceride control in setting of increased candy, off medication. Encouraged renewed efforts at dietary measures to control LDL cholesterol specifically increased fiber and legumes. The 10-year ASCVD risk score (Arnett DK, et al., 2019) is: 2.8%   Values used to calculate the score:     Age: 47 years     Sex: Male     Is Non-Hispanic African American: No     Diabetic: No     Tobacco smoker: No     Systolic Blood Pressure: 110 mmHg     Is BP treated: Yes     HDL Cholesterol: 30.6 mg/dL     Total Cholesterol: 182 mg/dL

## 2023-07-20 NOTE — Assessment & Plan Note (Signed)
Chronic, stable on allopurinol 100mg daily without recent gout flare.

## 2023-07-20 NOTE — Assessment & Plan Note (Addendum)
ALT mildly elevated. Imaging 2019 with evidence of fatty liver changes. Low Fib4 risk for fibrosis. Continue to monitor.    Fibrosis 4 Score = 1.05 (Low risk)      Interpretation for patients with NAFLD          <1.30       -  F0-F1 (Low risk)          1.30-2.67 -  Indeterminate           >2.67      -  F3-F4 (High risk)     Validated for ages 56-65

## 2023-07-20 NOTE — Assessment & Plan Note (Signed)
Chronic, great control on current regimen.  

## 2023-07-20 NOTE — Assessment & Plan Note (Signed)
Chronic, stable period on cialis 5mg  daily.

## 2023-07-20 NOTE — Progress Notes (Signed)
Ph: [phone] Fax: [phone]   Patient ID: Andrew Phillips, male    DOB: December 11, 1975, 47 y.o.   MRN: [id number]  This visit was conducted in person.  BP 110/76   Pulse 76   Temp 98.6 F (37 C) (Oral)   Ht 6' 1.5" (1.867 m)   Wt 255 lb (115.7 kg)   SpO2 97%   BMI 33.19 kg/m   BP Readings from Last 3 Encounters:  07/20/23 110/76  09/18/22 127/84  07/15/22 134/82   CC: CPE Subjective:   HPI: Andrew Phillips is a 47 y.o. male presenting on 07/20/2023 for Annual Exam   Gout - manages with PRN colchicine, last flare 2 wks ago. Avoiding thiazides. Gest on average 2 flares a year.    ED - stable period on daily cialis 5mg  daily   Had been running regularly, but not in the past 3 months.   Preventative: Colonoscopy 09/2022 - TAx 6, rpt 3 yrs ([facility]) Flu shot - today COVID vaccine - Pfizer 11/2019, 12/2019, booster 09/2020 Tdap 2012, 07/2022 Seat belt use discussed Sunscreen use discussed, no changing moles on skin. 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 Interim medical history since our last visit reviewed. Allergies and medications reviewed and updated. Outpatient Medications Prior to Visit  Medication Sig Dispense Refill   tretinoin (RETIN-A) 0.025 % cream SMARTSIG:Sparingly Topical Every Night     allopurinol (ZYLOPRIM) 100 MG tablet Take 1 tablet (100 mg total) by mouth daily. 90 tablet 3   amLODipine (NORVASC) 10 MG tablet Take 1 tablet (10 mg total) by mouth daily. 90 tablet 3   colchicine 0.6 MG tablet Take 1 tablet (0.6 mg total) by mouth daily as needed (gout flare).  Take 2 tablets on first day of gout flare 30 tablet 3   metoprolol succinate (TOPROL-XL) 50 MG 24 hr tablet TAKE 1 TABLET BY MOUTH EVERYDAY AT BEDTIME 90 tablet 3   tadalafil (CIALIS) 5 MG tablet TAKE 1 TABLET (5 MG TOTAL) BY MOUTH DAILY. 30 tablet 6   Multiple Vitamins-Minerals (CENTRUM ULTRA MENS PO) Take 1 tablet by mouth daily at 6 (six) AM.     No facility-administered medications prior to visit.     Per HPI unless specifically indicated in ROS section below Review of Systems  Constitutional:  Negative for activity change, appetite change, chills, fatigue, fever and unexpected weight change.  HENT:  Negative for hearing loss.   Eyes:  Negative for visual disturbance.  Respiratory:  Negative for cough, chest tightness, shortness of breath and wheezing.   Cardiovascular:  Negative for chest pain, palpitations and leg swelling.  Gastrointestinal:  Negative for abdominal distention, abdominal pain, blood in stool, constipation, diarrhea, nausea and vomiting.  Genitourinary:  Negative for difficulty urinating and hematuria.  Musculoskeletal:  Negative for arthralgias, myalgias and neck pain.  Skin:  Negative for rash.  Neurological:  Negative for dizziness, seizures, syncope and headaches.  Hematological:  Negative for adenopathy. Does not bruise/bleed easily.  Psychiatric/Behavioral:  Negative for dysphoric mood. The patient is not nervous/anxious.     Objective:  BP 110/76  Pulse 76   Temp 98.6 F (37 C) (Oral)   Ht 6' 1.5" (1.867 m)   Wt 255 lb (115.7 kg)   SpO2 97%   BMI 33.19 kg/m   Wt Readings from Last 3 Encounters:  07/20/23 255 lb (115.7 kg)  09/18/22 249 lb (112.9 kg)  08/19/22 249 lb (112.9 kg)      Physical Exam Vitals and nursing note reviewed.  Constitutional:      General: He is not in acute distress.    Appearance: Normal appearance. He is well-developed. He is not ill-appearing.  HENT:     Head: Normocephalic and atraumatic.     Right Ear: Hearing,  tympanic membrane, ear canal and external ear normal.     Left Ear: Hearing, tympanic membrane, ear canal and external ear normal.     Nose: Nose normal.     Mouth/Throat:     Mouth: Mucous membranes are moist.     Pharynx: Oropharynx is clear. No oropharyngeal exudate or posterior oropharyngeal erythema.  Eyes:     General: No scleral icterus.    Extraocular Movements: Extraocular movements intact.     Conjunctiva/sclera: Conjunctivae normal.     Pupils: Pupils are equal, round, and reactive to light.  Neck:     Thyroid: No thyroid mass or thyromegaly.  Cardiovascular:     Rate and Rhythm: Normal rate and regular rhythm.     Pulses: Normal pulses.          Radial pulses are 2+ on the right side and 2+ on the left side.     Heart sounds: Normal heart sounds. No murmur heard. Pulmonary:     Effort: Pulmonary effort is normal. No respiratory distress.     Breath sounds: Normal breath sounds. No wheezing, rhonchi or rales.  Abdominal:     General: Bowel sounds are normal. There is no distension.     Palpations: Abdomen is soft. There is no mass.     Tenderness: There is no abdominal tenderness. There is no guarding or rebound.     Hernia: No hernia is present.  Musculoskeletal:        General: Normal range of motion.     Cervical back: Normal range of motion and neck supple.     Right lower leg: No edema.     Left lower leg: No edema.  Lymphadenopathy:     Cervical: No cervical adenopathy.  Skin:    General: Skin is warm and dry.     Findings: No rash.  Neurological:     General: No focal deficit present.     Mental Status: He is alert and oriented to person, place, and time.  Psychiatric:        Mood and Affect: Mood normal.        Behavior: Behavior normal.        Thought Content: Thought content normal.        Judgment: Judgment normal.       Results for orders placed or performed in visit on 07/13/23  CBC with Differential/Platelet  Result Value Ref Range   WBC 5.2  4.0 - 10.5 K/uL   RBC 4.83 4.22 - 5.81 Mil/uL   Hemoglobin 14.3 13.0 - 17.0 g/dL   HCT 16.1 09.6 - 04.5 %   MCV 89.5 78.0 - 100.0 fl   MCHC 33.1 30.0 - 36.0 g/dL   RDW 40.9 81.1 - 91.4 %   Platelets 191.0 150.0 - 400.0 K/uL   Neutrophils Relative %  58.6 43.0 - 77.0 %   Lymphocytes Relative 29.7 12.0 - 46.0 %   Monocytes Relative 7.4 3.0 - 12.0 %   Eosinophils Relative 3.6 0.0 - 5.0 %   Basophils Relative 0.7 0.0 - 3.0 %   Neutro Abs 3.0 1.4 - 7.7 K/uL   Lymphs Abs 1.5 0.7 - 4.0 K/uL   Monocytes Absolute 0.4 0.1 - 1.0 K/uL   Eosinophils Absolute 0.2 0.0 - 0.7 K/uL   Basophils Absolute 0.0 0.0 - 0.1 K/uL  Microalbumin / creatinine urine ratio  Result Value Ref Range   Microalb, Ur 2.3 (H) 0.0 - 1.9 mg/dL   Creatinine,U 16.1 mg/dL   Microalb Creat Ratio 2.4 0.0 - 30.0 mg/g  Uric acid  Result Value Ref Range   Uric Acid, Serum 6.8 4.0 - 7.8 mg/dL  Comprehensive metabolic panel  Result Value Ref Range   Sodium 138 135 - 145 mEq/L   Potassium 4.3 3.5 - 5.1 mEq/L   Chloride 100 96 - 112 mEq/L   CO2 29 19 - 32 mEq/L   Glucose, Bld 111 (H) 70 - 99 mg/dL   BUN 12 6 - 23 mg/dL   Creatinine, Ser 0.96 0.40 - 1.50 mg/dL   Total Bilirubin 0.8 0.2 - 1.2 mg/dL   Alkaline Phosphatase 65 39 - 117 U/L   AST 35 0 - 37 U/L   ALT 62 (H) 0 - 53 U/L   Total Protein 7.6 6.0 - 8.3 g/dL   Albumin 4.8 3.5 - 5.2 g/dL   GFR 04.54 >09.81 mL/min   Calcium 9.4 8.4 - 10.5 mg/dL  Lipid panel  Result Value Ref Range   Cholesterol 182 0 - 200 mg/dL   Triglycerides 191.4 (H) 0.0 - 149.0 mg/dL   HDL 78.29 (L) >56.21 mg/dL   VLDL 30.8 (H) 0.0 - 65.7 mg/dL   LDL Cholesterol 96 0 - 99 mg/dL   Total CHOL/HDL Ratio 6    NonHDL 150.95    Lab Results  Component Value Date   TESTOSTERONE 527 10/08/2022    Assessment & Plan:   Problem List Items Addressed This Visit     Health maintenance examination - Primary (Chronic)    Preventative protocols reviewed and updated unless pt declined. Discussed  healthy diet and lifestyle.       HLD (hyperlipidemia)    Chronic, deteriorated triglyceride control in setting of increased candy, off medication. Encouraged renewed efforts at dietary measures to control LDL cholesterol specifically increased fiber and legumes. The 10-year ASCVD risk score (Arnett DK, et al., 2019) is: 2.8%   Values used to calculate the score:     Age: 35 years     Sex: Male     Is Non-Hispanic African American: No     Diabetic: No     Tobacco smoker: No     Systolic Blood Pressure: 110 mmHg     Is BP treated: Yes     HDL Cholesterol: 30.6 mg/dL     Total Cholesterol: 182 mg/dL       Relevant Medications   metoprolol succinate (TOPROL-XL) 50 MG 24 hr tablet   amLODipine (NORVASC) 10 MG tablet   tadalafil (CIALIS) 5 MG tablet   Essential hypertension    Chronic, great control on current regimen.       Relevant Medications   metoprolol succinate (TOPROL-XL) 50 MG 24 hr tablet   amLODipine (NORVASC) 10 MG tablet   tadalafil (CIALIS) 5 MG tablet   NASH (nonalcoholic steatohepatitis)  ALT mildly elevated. Imaging 2019 with evidence of fatty liver changes. Low Fib4 risk for fibrosis. Continue to monitor.    Fibrosis 4 Score = 1.05 (Low risk)      Interpretation for patients with NAFLD          <1.30       -  F0-F1 (Low risk)          1.30-2.67 -  Indeterminate           >2.67      -  F3-F4 (High risk)     Validated for ages 18-65      Obesity, Class I, BMI 30-34.9    Encourage healthy diet and lifestyle choices to affect sustainable weight loss.       ED (erectile dysfunction) of organic origin    Chronic, stable period on cialis 5mg  daily.       Relevant Medications   tadalafil (CIALIS) 5 MG tablet   Idiopathic chronic gout of left foot without tophus    Chronic, stable on allopurinol 100mg daily without recent gout flare.       Relevant Medications   allopurinol (ZYLOPRIM) 100 MG tablet   colchicine 0.6 MG tablet   Other Visit Diagnoses      Encounter for immunization       Relevant Orders   Flu vaccine trivalent PF, 6mos and older(Flulaval,Afluria,Fluarix,Fluzone) (Completed)        Meds ordered this encounter  Medications   allopurinol (ZYLOPRIM) 100 MG tablet    Sig: Take 1 tablet (100 mg total) by mouth daily.    Dispense:  90 tablet    Refill:  4   colchicine 0.6 MG tablet    Sig: Take 1 tablet (0.6 mg total) by mouth daily as needed (gout flare). Take 2 tablets on first day of gout flare    Dispense:  30 tablet    Refill:  3   metoprolol succinate (TOPROL-XL) 50 MG 24 hr tablet    Sig: TAKE 1 TABLET BY MOUTH EVERYDAY AT BEDTIME    Dispense:  90 tablet    Refill:  4   amLODipine (NORVASC) 10 MG tablet    Sig: Take 1 tablet (10 mg total) by mouth daily.    Dispense:  90 tablet    Refill:  4   tadalafil (CIALIS) 5 MG tablet    Sig: Take 1 tablet (5 mg total) by mouth daily.    Dispense:  90 tablet    Refill:  4    Orders Placed This Encounter  Procedures   Flu vaccine trivalent PF, 6mos and older(Flulaval,Afluria,Fluarix,Fluzone)    Patient Instructions  Flu shot today  Forms for work filled out today  Good to see you today Return as needed or in 1 year for next physical.   Follow up plan: Return in about 1 year (around 07/19/2024) for annual exam, prior fasting for blood work.  Eustaquio Boyden, MD

## 2023-09-02 ENCOUNTER — Other Ambulatory Visit: Payer: Self-pay | Admitting: Family Medicine

## 2023-09-02 DIAGNOSIS — I10 Essential (primary) hypertension: Secondary | ICD-10-CM

## 2023-09-02 NOTE — Telephone Encounter (Signed)
Too soon. Rx sent on 07/20/23, #90/4 to [facility].  Request denied.

## 2023-10-06 ENCOUNTER — Other Ambulatory Visit: Payer: Self-pay | Admitting: Family Medicine

## 2023-10-06 DIAGNOSIS — M1A0720 Idiopathic chronic gout, left ankle and foot, without tophus (tophi): Secondary | ICD-10-CM

## 2024-02-28 ENCOUNTER — Ambulatory Visit: Admitting: Family Medicine

## 2024-02-28 ENCOUNTER — Encounter: Payer: Self-pay | Admitting: Family Medicine

## 2024-02-28 VITALS — BP 136/86 | HR 69 | Temp 98.1°F | Ht 73.5 in | Wt 271.0 lb

## 2024-02-28 DIAGNOSIS — T504X5A Adverse effect of drugs affecting uric acid metabolism, initial encounter: Secondary | ICD-10-CM | POA: Diagnosis not present

## 2024-02-28 DIAGNOSIS — M1A0720 Idiopathic chronic gout, left ankle and foot, without tophus (tophi): Secondary | ICD-10-CM

## 2024-02-28 DIAGNOSIS — D7212 Drug rash with eosinophilia and systemic symptoms syndrome: Secondary | ICD-10-CM | POA: Diagnosis not present

## 2024-02-28 NOTE — Patient Instructions (Signed)
 Stop allopurinol  due to likely drug rash to this medicine.  Stay off daily gout medicine for now, but if we get recurrence of gout flares will need to consider different daily gout prevention medicine.  Consider Uloric vs Probenecid - handouts provided today. Let me know if you decide to try one otherwise we can reassess at physical.  In the meantime, avoid beer, shrimp/shellfish, organ meats.

## 2024-02-28 NOTE — Progress Notes (Signed)
 Ph: [phone] Fax: [phone]   Patient ID: Andrew Phillips, male    DOB: [birth year], 48 y.o.   MRN: [id number]  This visit was conducted in person.  BP 136/86   Pulse 69   Temp 98.1 F (36.7 C) (Oral)   Ht 6' 1.5 (1.867 m)   Wt 271 lb (122.9 kg)   SpO2 97%   BMI 35.27 kg/m    CC: rash to ankles Subjective:   HPI: Andrew Phillips is a 48 y.o. male presenting on 02/28/2024 for Rash (C/o red rash around B ankles. Thinks reaction to allopurinol - rash resolved. Restarted med and rash returned. 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   H/o gout managed with PRN colchicine . Was getting 2 flares per year so last  visit we started allopurinol  100mg  daily.   Has noted itchy red fash develop initially behind knees then progressed to medial ankles.  Stopped allopurinol  and it cleared up within 7-10 days so he restarted allopurinol  - with recurrence of rash.      Relevant past medical, surgical, family and social history reviewed and updated as indicated. Interim medical history since our last visit reviewed. Allergies and medications reviewed and updated. Outpatient Medications Prior to Visit  Medication Sig Dispense Refill   amLODipine  (NORVASC ) 10 MG tablet Take 1 tablet (10 mg total) by mouth daily. 90 tablet 4   colchicine  0.6 MG tablet Take 1 tablet (0.6 mg total) by mouth daily as needed (gout flare). Take 2 tablets on first day of gout flare 30 tablet 3   metoprolol  succinate (TOPROL -XL) 50 MG 24 hr tablet TAKE 1 TABLET BY MOUTH EVERYDAY AT BEDTIME 90 tablet 4   tadalafil  (CIALIS ) 5 MG tablet Take 1 tablet (5 mg total) by mouth daily. 90 tablet 4   tretinoin (RETIN-A) 0.025 % cream SMARTSIG:Sparingly Topical Every Night     allopurinol  (ZYLOPRIM ) 100 MG tablet TAKE 1 TABLET BY MOUTH EVERY DAY 90 tablet 3   No facility-administered medications prior to visit.     Per HPI unless specifically indicated in ROS section below Review of  Systems  Objective:  BP 136/86   Pulse 69   Temp 98.1 F (36.7 C) (Oral)   Ht 6' 1.5 (1.867 m)   Wt 271 lb (122.9 kg)   SpO2 97%   BMI 35.27 kg/m   Wt Readings from Last 3 Encounters:  02/28/24 271 lb (122.9 kg)  07/20/23 255 lb (115.7 kg)  09/18/22 249 lb (112.9 kg)      Physical Exam Vitals and nursing note reviewed.  Constitutional:      Appearance: Normal appearance. He is not ill-appearing.   Musculoskeletal:     Right lower leg: No edema.     Left lower leg: No edema.   Skin:    General: Skin is warm and dry.     Findings: Erythema and rash present.     Comments:  Maculopapular erythematous nonblanching rash affecting bilateral medial lower legs from ankle to calf    Neurological:     Mental Status: He is alert.   Psychiatric:        Mood and Affect: Mood normal.        Behavior: Behavior normal.       Lab Results  Component Value Date   LABURIC 6.8 07/13/2023    Lab Results  Component Value Date   NA 138 07/13/2023   CL 100 07/13/2023  K 4.3 07/13/2023   CO2 29 07/13/2023   BUN 12 07/13/2023   CREATININE 0.97 07/13/2023   GFR 94.07 07/13/2023   CALCIUM 9.4 07/13/2023   ALBUMIN 4.8 07/13/2023   GLUCOSE 111 (H) 07/13/2023     Assessment & Plan:   Problem List Items Addressed This Visit     Idiopathic chronic gout of left foot without tophus   Did well on allopurinol  without recurrent gout flares until developed skin rash to allopurinol  treatment (suspected AHS).  Reviewed alternative treatment options and their mechanisms of action including febuxostat (with associated cardiovascular risk) and probenecid (with multiple doses per day). He prefers to just treat gout flares with colchicine  PRN and reassess at CPE later this year.  Again reviewed diet choices to prevent gout fares.       Allopurinol  hypersensitivity syndrome - Primary   Suspect delayed hypersensitivity reaction to allopurinol . Will stop now. Update if rash doesn't fully  resolve off allopurinol .  See below regarding gout treatment alternatives.         No orders of the defined types were placed in this encounter.   No orders of the defined types were placed in this encounter.   Patient Instructions  Stop allopurinol  due to likely drug rash to this medicine.  Stay off daily gout medicine for now, but if we get recurrence of gout flares will need to consider different daily gout prevention medicine.  Consider Uloric vs Probenecid - handouts provided today. Let me know if you decide to try one otherwise we can reassess at physical.  In the meantime, avoid beer, shrimp/shellfish, organ meats.   Follow up plan: No follow-ups on file.  Anton Blas, MD

## 2024-02-28 NOTE — Assessment & Plan Note (Signed)
 Suspect delayed hypersensitivity reaction to allopurinol . Will stop now. Update if rash doesn't fully resolve off allopurinol .  See below regarding gout treatment alternatives.

## 2024-02-28 NOTE — Assessment & Plan Note (Signed)
 Did well on allopurinol  without recurrent gout flares until developed skin rash to allopurinol  treatment (suspected AHS).  Reviewed alternative treatment options and their mechanisms of action including febuxostat (with associated cardiovascular risk) and probenecid (with multiple doses per day). He prefers to just treat gout flares with colchicine  PRN and reassess at CPE later this year.  Again reviewed diet choices to prevent gout fares.

## 2024-07-09 ENCOUNTER — Other Ambulatory Visit: Payer: Self-pay | Admitting: Family Medicine

## 2024-07-09 DIAGNOSIS — I10 Essential (primary) hypertension: Secondary | ICD-10-CM

## 2024-07-09 DIAGNOSIS — M1A0720 Idiopathic chronic gout, left ankle and foot, without tophus (tophi): Secondary | ICD-10-CM

## 2024-07-09 DIAGNOSIS — K7581 Nonalcoholic steatohepatitis (NASH): Secondary | ICD-10-CM

## 2024-07-09 DIAGNOSIS — E782 Mixed hyperlipidemia: Secondary | ICD-10-CM

## 2024-07-14 ENCOUNTER — Other Ambulatory Visit (INDEPENDENT_AMBULATORY_CARE_PROVIDER_SITE_OTHER)

## 2024-07-14 DIAGNOSIS — I10 Essential (primary) hypertension: Secondary | ICD-10-CM

## 2024-07-14 DIAGNOSIS — K7581 Nonalcoholic steatohepatitis (NASH): Secondary | ICD-10-CM

## 2024-07-14 DIAGNOSIS — M1A0720 Idiopathic chronic gout, left ankle and foot, without tophus (tophi): Secondary | ICD-10-CM

## 2024-07-14 DIAGNOSIS — E782 Mixed hyperlipidemia: Secondary | ICD-10-CM | POA: Diagnosis not present

## 2024-07-14 LAB — CBC WITH DIFFERENTIAL/PLATELET
Basophils Absolute: 0 K/uL (ref 0.0–0.1)
Basophils Relative: 1.1 % (ref 0.0–3.0)
Eosinophils Absolute: 0.2 K/uL (ref 0.0–0.7)
Eosinophils Relative: 4.3 % (ref 0.0–5.0)
HCT: 41.2 % (ref 39.0–52.0)
Hemoglobin: 14.1 g/dL (ref 13.0–17.0)
Lymphocytes Relative: 29.7 % (ref 12.0–46.0)
Lymphs Abs: 1.3 K/uL (ref 0.7–4.0)
MCHC: 34.2 g/dL (ref 30.0–36.0)
MCV: 87 fl (ref 78.0–100.0)
Monocytes Absolute: 0.4 K/uL (ref 0.1–1.0)
Monocytes Relative: 8.5 % (ref 3.0–12.0)
Neutro Abs: 2.4 K/uL (ref 1.4–7.7)
Neutrophils Relative %: 56.4 % (ref 43.0–77.0)
Platelets: 184 K/uL (ref 150.0–400.0)
RBC: 4.74 Mil/uL (ref 4.22–5.81)
RDW: 13.7 % (ref 11.5–15.5)
WBC: 4.3 K/uL (ref 4.0–10.5)

## 2024-07-14 LAB — COMPREHENSIVE METABOLIC PANEL WITH GFR
ALT: 77 U/L — ABNORMAL HIGH (ref 0–53)
AST: 47 U/L — ABNORMAL HIGH (ref 0–37)
Albumin: 4.8 g/dL (ref 3.5–5.2)
Alkaline Phosphatase: 59 U/L (ref 39–117)
BUN: 13 mg/dL (ref 6–23)
CO2: 28 meq/L (ref 19–32)
Calcium: 9.3 mg/dL (ref 8.4–10.5)
Chloride: 102 meq/L (ref 96–112)
Creatinine, Ser: 0.96 mg/dL (ref 0.40–1.50)
GFR: 94.58 mL/min (ref >60.00)
Glucose, Bld: 101 mg/dL — ABNORMAL HIGH (ref 70–99)
Potassium: 4 meq/L (ref 3.5–5.1)
Sodium: 139 meq/L (ref 135–145)
Total Bilirubin: 0.8 mg/dL (ref 0.2–1.2)
Total Protein: 7.7 g/dL (ref 6.0–8.3)

## 2024-07-14 LAB — LIPID PANEL
Cholesterol: 186 mg/dL (ref 0–200)
HDL: 33.3 mg/dL — ABNORMAL LOW (ref >39.00)
LDL Cholesterol: 119 mg/dL — ABNORMAL HIGH (ref 0–99)
NonHDL: 152.89
Total CHOL/HDL Ratio: 6
Triglycerides: 167 mg/dL — ABNORMAL HIGH (ref 0.0–149.0)
VLDL: 33.4 mg/dL (ref 0.0–40.0)

## 2024-07-14 LAB — URIC ACID: Uric Acid, Serum: 6.7 mg/dL (ref 4.0–7.8)

## 2024-07-14 LAB — MICROALBUMIN / CREATININE URINE RATIO
Creatinine,U: 106.2 mg/dL
Microalb Creat Ratio: 11.8 mg/g (ref 0.0–30.0)
Microalb, Ur: 1.3 mg/dL (ref 0.0–1.9)

## 2024-07-15 ENCOUNTER — Ambulatory Visit: Payer: Self-pay | Admitting: Family Medicine

## 2024-07-21 ENCOUNTER — Ambulatory Visit: Admitting: Family Medicine

## 2024-07-21 ENCOUNTER — Encounter: Payer: Self-pay | Admitting: Family Medicine

## 2024-07-21 VITALS — BP 124/80 | HR 67 | Temp 98.1°F | Ht 73.5 in | Wt 265.0 lb

## 2024-07-21 DIAGNOSIS — K760 Fatty (change of) liver, not elsewhere classified: Secondary | ICD-10-CM

## 2024-07-21 DIAGNOSIS — Z0000 Encounter for general adult medical examination without abnormal findings: Secondary | ICD-10-CM

## 2024-07-21 DIAGNOSIS — E66811 Obesity, class 1: Secondary | ICD-10-CM

## 2024-07-21 DIAGNOSIS — Z23 Encounter for immunization: Secondary | ICD-10-CM | POA: Diagnosis not present

## 2024-07-21 DIAGNOSIS — I10 Essential (primary) hypertension: Secondary | ICD-10-CM | POA: Diagnosis not present

## 2024-07-21 DIAGNOSIS — N529 Male erectile dysfunction, unspecified: Secondary | ICD-10-CM

## 2024-07-21 DIAGNOSIS — M1A0720 Idiopathic chronic gout, left ankle and foot, without tophus (tophi): Secondary | ICD-10-CM

## 2024-07-21 DIAGNOSIS — E782 Mixed hyperlipidemia: Secondary | ICD-10-CM

## 2024-07-21 MED ORDER — METOPROLOL SUCCINATE ER 50 MG PO TB24
ORAL_TABLET | ORAL | 3 refills | Status: AC
Start: 2024-07-21 — End: ?

## 2024-07-21 MED ORDER — AMLODIPINE BESYLATE 10 MG PO TABS: 10.0000 mg | ORAL_TABLET | Freq: Every day | ORAL | 3 refills | Status: AC

## 2024-07-21 MED ORDER — TADALAFIL 5 MG PO TABS: 5.0000 mg | ORAL_TABLET | Freq: Every day | ORAL | 3 refills | Status: AC

## 2024-07-21 NOTE — Progress Notes (Signed)
 Ph: [phone] Fax: [phone]   Patient ID: Andrew Phillips, male    DOB: June 05, 1976, 48 y.o.   MRN: [id number]  This visit was conducted in person.  BP 124/80   Pulse 67   Temp 98.1 F (36.7 C) (Oral)   Ht 6' 1.5 (1.867 m)   Wt 265 lb (120.2 kg)   SpO2 95%   BMI 34.49 kg/m    CC: CPE  Subjective:   HPI: Andrew Phillips is a 48 y.o. 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   Gout - gets on average 2 flares per year. Allopurinol  was helpful but then developed rash to this - allopurinol  subsequently stopped, now managing only with PRN colchicine .   H/o fatty liver disease.  No fmhx liver cirrhosis.   No fmhx CAD.   Preventative: Colonoscopy 09/2022 - TAx 6, rpt 3 yrs ([facility]) Flu shot - today COVID vaccine - Pfizer 11/2019, 12/2019, booster 09/2020 Tdap 2012, 07/2022 Seat belt use discussed Sunscreen use discussed, no changing moles on skin. 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 Interim medical history since our last visit reviewed. Allergies and medications reviewed and updated. Outpatient Medications Prior to Visit  Medication Sig Dispense Refill   colchicine  0.6 MG tablet Take 1 tablet (0.6 mg total) by mouth daily as needed (gout flare). Take 2 tablets on first day of gout flare 30 tablet 3   amLODipine  (NORVASC ) 10 MG tablet Take 1 tablet (10 mg total) by mouth daily. 90 tablet 4   metoprolol  succinate (TOPROL -XL) 50 MG 24 hr tablet TAKE 1 TABLET BY MOUTH EVERYDAY AT BEDTIME 90 tablet 4   tadalafil  (CIALIS ) 5 MG tablet Take 1 tablet (5  mg total) by mouth daily. 90 tablet 4   tretinoin (RETIN-A) 0.025 % cream SMARTSIG:Sparingly Topical Every Night (Patient not taking: Reported on 07/21/2024)     No facility-administered medications prior to visit.     Per HPI unless specifically indicated in ROS section below Review of Systems  Constitutional:  Negative for activity change, appetite change, chills, fatigue, fever and unexpected weight change.  HENT:  Negative for hearing loss.   Eyes:  Negative for visual disturbance.  Respiratory:  Negative for cough, chest tightness, shortness of breath and wheezing.   Cardiovascular:  Negative for chest pain, palpitations and leg swelling.  Gastrointestinal:  Negative for abdominal distention, abdominal pain, blood in stool, constipation, diarrhea, nausea and vomiting.  Genitourinary:  Negative for difficulty urinating and hematuria.  Musculoskeletal:  Negative for arthralgias, myalgias and neck pain.  Skin:  Negative for rash.  Neurological:  Negative for dizziness, seizures, syncope and headaches.  Hematological:  Negative for adenopathy. Does not bruise/bleed easily.  Psychiatric/Behavioral:  Negative for dysphoric mood. The patient is not nervous/anxious.     Objective:  BP 124/80   Pulse 67   Temp 98.1 F (36.7 C) (Oral)   Ht 6' 1.5 (1.867 m)   Wt 265 lb (120.2 kg)   SpO2 95%   BMI 34.49 kg/m   Wt Readings from  Last 3 Encounters:  07/21/24 265 lb (120.2 kg)  02/28/24 271 lb (122.9 kg)  07/20/23 255 lb (115.7 kg)      Physical Exam Vitals and nursing note reviewed.  Constitutional:      General: He is not in acute distress.    Appearance: Normal appearance. He is well-developed. He is not ill-appearing.  HENT:     Head: Normocephalic and atraumatic.     Right Ear: Hearing, tympanic membrane, ear canal and external ear normal.     Left Ear: Hearing, tympanic membrane, ear canal and external ear normal.     Mouth/Throat:     Mouth: Mucous membranes are moist.      Pharynx: Oropharynx is clear. No oropharyngeal exudate or posterior oropharyngeal erythema.  Eyes:     General: No scleral icterus.    Extraocular Movements: Extraocular movements intact.     Conjunctiva/sclera: Conjunctivae normal.     Pupils: Pupils are equal, round, and reactive to light.  Neck:     Thyroid : No thyroid  mass or thyromegaly.  Cardiovascular:     Rate and Rhythm: Normal rate and regular rhythm.     Pulses: Normal pulses.          Radial pulses are 2+ on the right side and 2+ on the left side.     Heart sounds: Normal heart sounds. No murmur heard. Pulmonary:     Effort: Pulmonary effort is normal. No respiratory distress.     Breath sounds: Normal breath sounds. No wheezing, rhonchi or rales.  Abdominal:     General: Bowel sounds are normal. There is no distension.     Palpations: Abdomen is soft. There is no mass.     Tenderness: There is no abdominal tenderness. There is no guarding or rebound.     Hernia: No hernia is present.  Musculoskeletal:        General: Normal range of motion.     Cervical back: Normal range of motion and neck supple.     Right lower leg: No edema.     Left lower leg: No edema.  Lymphadenopathy:     Cervical: No cervical adenopathy.  Skin:    General: Skin is warm and dry.     Findings: No rash.  Neurological:     General: No focal deficit present.     Mental Status: He is alert and oriented to person, place, and time.  Psychiatric:        Mood and Affect: Mood normal.        Behavior: Behavior normal.        Thought Content: Thought content normal.        Judgment: Judgment normal.       Results for orders placed or performed in visit on 07/14/24  Microalbumin / creatinine urine ratio   Collection Time: 07/14/24  8:01 AM  Result Value Ref Range   Microalb, Ur 1.3 0.0 - 1.9 mg/dL   Creatinine,U 893.7 mg/dL   Microalb Creat Ratio 11.8 0.0 - 30.0 mg/g  Uric acid   Collection Time: 07/14/24  8:01 AM  Result Value Ref Range    Uric Acid, Serum 6.7 4.0 - 7.8 mg/dL  CBC with Differential/Platelet   Collection Time: 07/14/24  8:01 AM  Result Value Ref Range   WBC 4.3 4.0 - 10.5 K/uL   RBC 4.74 4.22 - 5.81 Mil/uL   Hemoglobin 14.1 13.0 - 17.0 g/dL   HCT 58.7 60.9 - 47.9 %   MCV 87.0  78.0 - 100.0 fl   MCHC 34.2 30.0 - 36.0 g/dL   RDW 86.2 88.4 - 84.4 %   Platelets 184.0 150.0 - 400.0 K/uL   Neutrophils Relative % 56.4 43.0 - 77.0 %   Lymphocytes Relative 29.7 12.0 - 46.0 %   Monocytes Relative 8.5 3.0 - 12.0 %   Eosinophils Relative 4.3 0.0 - 5.0 %   Basophils Relative 1.1 0.0 - 3.0 %   Neutro Abs 2.4 1.4 - 7.7 K/uL   Lymphs Abs 1.3 0.7 - 4.0 K/uL   Monocytes Absolute 0.4 0.1 - 1.0 K/uL   Eosinophils Absolute 0.2 0.0 - 0.7 K/uL   Basophils Absolute 0.0 0.0 - 0.1 K/uL  Comprehensive metabolic panel with GFR   Collection Time: 07/14/24  8:01 AM  Result Value Ref Range   Sodium 139 135 - 145 mEq/L   Potassium 4.0 3.5 - 5.1 mEq/L   Chloride 102 96 - 112 mEq/L   CO2 28 19 - 32 mEq/L   Glucose, Bld 101 (H) 70 - 99 mg/dL   BUN 13 6 - 23 mg/dL   Creatinine, Ser 9.03 0.40 - 1.50 mg/dL   Total Bilirubin 0.8 0.2 - 1.2 mg/dL   Alkaline Phosphatase 59 39 - 117 U/L   AST 47 (H) 0 - 37 U/L   ALT 77 (H) 0 - 53 U/L   Total Protein 7.7 6.0 - 8.3 g/dL   Albumin 4.8 3.5 - 5.2 g/dL   GFR 05.41 >39.99 mL/min   Calcium 9.3 8.4 - 10.5 mg/dL  Lipid panel   Collection Time: 07/14/24  8:01 AM  Result Value Ref Range   Cholesterol 186 0 - 200 mg/dL   Triglycerides 832.9 (H) 0.0 - 149.0 mg/dL   HDL 66.69 (L) >60.99 mg/dL   VLDL 66.5 0.0 - 59.9 mg/dL   LDL Cholesterol 880 (H) 0 - 99 mg/dL   Total CHOL/HDL Ratio 6    NonHDL 152.89     Assessment & Plan:   Problem List Items Addressed This Visit     Health maintenance examination - Primary (Chronic)   Preventative protocols reviewed and updated unless pt declined. Discussed healthy diet and lifestyle.       HLD (hyperlipidemia)   Chronic, stable to improved off  medication. Reviewed diet choices to affect improved cholesterol control.  The 10-year ASCVD risk score (Arnett DK, et al., 2019) is: 3.6%   Values used to calculate the score:     Age: 53 years     Clincally relevant sex: Male     Is Non-Hispanic African American: No     Diabetic: No     Tobacco smoker: No     Systolic Blood Pressure: 124 mmHg     Is BP treated: Yes     HDL Cholesterol: 33.3 mg/dL     Total Cholesterol: 186 mg/dL       Relevant Medications   amLODipine  (NORVASC ) 10 MG tablet   metoprolol  succinate (TOPROL -XL) 50 MG 24 hr tablet   tadalafil  (CIALIS ) 5 MG tablet   Other Relevant Orders   US  ELASTOGRAPHY LIVER   Essential hypertension   Chronic, stable on current regimen.       Relevant Medications   amLODipine  (NORVASC ) 10 MG tablet   metoprolol  succinate (TOPROL -XL) 50 MG 24 hr tablet   tadalafil  (CIALIS ) 5 MG tablet   Metabolic dysfunction-associated steatotic liver disease (MASLD)   Fib4 score increased this year along (now indeterminate range) with mildly increased LFTs.  Last RUQ  US  done 2019.  Will order liver US  elastography for further evaluation.  Continue to avoid alcohol. To consider coffee consumption.    Fibrosis 4 Score = 1.34 (Indeterminate)      Interpretation for patients with NAFLD          <1.30       -  F0-F1 (Low risk)          1.30-2.67 -  Indeterminate           >2.67      -  F3-F4 (High risk)     Validated for ages 5-65      Relevant Orders   US  ELASTOGRAPHY LIVER   Obesity, Class I, BMI 30-34.9   Continue to encourage healthy diet and lifestyle changes to effect sustainable weight loss.        Relevant Orders   US  ELASTOGRAPHY LIVER   ED (erectile dysfunction) of organic origin   Continue cialis  5mg  daily      Relevant Medications   tadalafil  (CIALIS ) 5 MG tablet   Idiopathic chronic gout of left foot without tophus   Chronic, stable off allopurinol  without recent gout flare - continue PRN colchicine .       Other  Visit Diagnoses       Encounter for immunization       Relevant Orders   Flu vaccine trivalent PF, 6mos and older(Flulaval,Afluria,Fluarix,Fluzone) (Completed)        Meds ordered this encounter  Medications   amLODipine  (NORVASC ) 10 MG tablet    Sig: Take 1 tablet (10 mg total) by mouth daily.    Dispense:  90 tablet    Refill:  3   metoprolol  succinate (TOPROL -XL) 50 MG 24 hr tablet    Sig: TAKE 1 TABLET BY MOUTH EVERYDAY AT BEDTIME    Dispense:  90 tablet    Refill:  3   tadalafil  (CIALIS ) 5 MG tablet    Sig: Take 1 tablet (5 mg total) by mouth daily.    Dispense:  90 tablet    Refill:  3    Orders Placed This Encounter  Procedures   US  ELASTOGRAPHY LIVER    Standing Status:   Future    Expiration Date:   07/22/2025    Reason for Exam (SYMPTOM  OR DIAGNOSIS REQUIRED):   MASLD, with persistent transaminitis and indeterminate range fib-4 score    Preferred imaging location?:   [facility]   Flu vaccine trivalent PF, 6mos and older(Flulaval,Afluria,Fluarix,Fluzone)    Patient Instructions  Flu shot today  Get back to regular exercise routine.  Good to see you today  Return as needed or in 1 year for next physical.   Follow up plan: Return in about 1 year (around 07/21/2025) for annual exam, prior fasting for blood work.  Anton Blas, MD

## 2024-07-21 NOTE — Assessment & Plan Note (Signed)
 Chronic, stable on current regimen.

## 2024-07-21 NOTE — Assessment & Plan Note (Signed)
 Preventative protocols reviewed and updated unless pt declined. Discussed healthy diet and lifestyle.

## 2024-07-21 NOTE — Assessment & Plan Note (Signed)
 Chronic, stable off allopurinol  without recent gout flare - continue PRN colchicine .

## 2024-07-21 NOTE — Assessment & Plan Note (Signed)
Continue cialis 5 mg daily.  

## 2024-07-21 NOTE — Assessment & Plan Note (Signed)
 Continue to encourage healthy diet and lifestyle changes to effect sustainable weight loss.

## 2024-07-21 NOTE — Assessment & Plan Note (Addendum)
 Chronic, stable to improved off medication. Reviewed diet choices to affect improved cholesterol control.  The 10-year ASCVD risk score (Arnett DK, et al., 2019) is: 3.6%   Values used to calculate the score:     Age: 48 years     Clincally relevant sex: Male     Is Non-Hispanic African American: No     Diabetic: No     Tobacco smoker: No     Systolic Blood Pressure: 124 mmHg     Is BP treated: Yes     HDL Cholesterol: 33.3 mg/dL     Total Cholesterol: 186 mg/dL

## 2024-07-21 NOTE — Assessment & Plan Note (Addendum)
 Fib4 score increased this year along (now indeterminate range) with mildly increased LFTs.  Last RUQ US  done 2019.  Will order liver US  elastography for further evaluation.  Continue to avoid alcohol. To consider coffee consumption.    Fibrosis 4 Score = 1.34 (Indeterminate)      Interpretation for patients with NAFLD          <1.30       -  F0-F1 (Low risk)          1.30-2.67 -  Indeterminate           >2.67      -  F3-F4 (High risk)     Validated for ages 61-65

## 2024-07-21 NOTE — Patient Instructions (Addendum)
 Flu shot today  Get back to regular exercise routine.  Good to see you today  Return as needed or in 1 year for next physical.

## 2024-08-08 ENCOUNTER — Ambulatory Visit
Admission: RE | Admit: 2024-08-08 | Discharge: 2024-08-08 | Disposition: A | Source: Ambulatory Visit | Attending: Family Medicine | Admitting: Family Medicine

## 2024-08-08 DIAGNOSIS — E782 Mixed hyperlipidemia: Secondary | ICD-10-CM

## 2024-08-08 DIAGNOSIS — K760 Fatty (change of) liver, not elsewhere classified: Secondary | ICD-10-CM

## 2024-08-08 DIAGNOSIS — E66811 Obesity, class 1: Secondary | ICD-10-CM

## 2024-08-10 ENCOUNTER — Ambulatory Visit: Payer: Self-pay | Admitting: Family Medicine

## 2024-08-10 DIAGNOSIS — K760 Fatty (change of) liver, not elsewhere classified: Secondary | ICD-10-CM

## 2024-08-22 ENCOUNTER — Other Ambulatory Visit (HOSPITAL_COMMUNITY): Payer: Self-pay

## 2024-08-22 ENCOUNTER — Telehealth: Payer: Self-pay

## 2024-08-22 MED ORDER — WEGOVY 0.25 MG/0.5ML ~~LOC~~ SOAJ: 0.2500 mg | SUBCUTANEOUS | 0 refills | Status: DC

## 2024-08-22 MED ORDER — WEGOVY 0.5 MG/0.5ML ~~LOC~~ SOAJ: 0.5000 mg | SUBCUTANEOUS | 3 refills | Status: AC

## 2024-08-22 NOTE — Telephone Encounter (Signed)
 ERx wegovy  for MASLD (K76.0).  Can we request PA through pharmacy team?

## 2024-08-22 NOTE — Telephone Encounter (Signed)
 Pharmacy Patient Advocate Encounter   Received notification from Patient Advice Request messages that prior authorization for Wegovy  0.25 is required/requested.   Insurance verification completed.   The patient is insured through [facility].   Per test claim: PA required; PA submitted to above mentioned insurance via Latent Key/confirmation #/EOC [id number] Status is pending

## 2024-08-28 ENCOUNTER — Other Ambulatory Visit (HOSPITAL_COMMUNITY): Payer: Self-pay

## 2024-08-28 NOTE — Telephone Encounter (Signed)
 Pharmacy Patient Advocate Encounter  Received notification from [employer] that Prior Authorization for Wegovy  0.25 has been APPROVED from 08/23/24 to 02/20/25. Unable to obtain price due to refill too soon rejection, last fill date 08/23/24 next available fill date1/7/26   PA #/Case ID/Reference #: # [id number]

## 2024-08-30 NOTE — Telephone Encounter (Signed)
My chart message sent to make patient aware.

## 2024-09-11 ENCOUNTER — Telehealth: Payer: Self-pay

## 2024-09-11 ENCOUNTER — Other Ambulatory Visit (HOSPITAL_COMMUNITY): Payer: Self-pay

## 2024-09-11 NOTE — Telephone Encounter (Signed)
 Pharmacy Patient Advocate Encounter   Received notification from Physician's Office that prior authorization for Wegovy  0.5MG /0.5ML auto-injectors is required/requested.   Insurance verification completed.   The patient is insured through [facility].   Per test claim: PA required; PA started via CoverMyMeds. KEY [id number] . Waiting for clinical questions to populate.

## 2024-09-12 ENCOUNTER — Other Ambulatory Visit (HOSPITAL_COMMUNITY): Payer: Self-pay

## 2024-09-12 NOTE — Telephone Encounter (Signed)
 Pharmacy Patient Advocate Encounter  Received notification from [employer] that Prior Authorization for  Wegovy  0.5MG /0.5ML auto-injectors  has been APPROVED from 09/12/2024 to 09/12/2025. Ran test claim, Copay is $49.99. This test claim was processed through [facility]- copay amounts may vary at other pharmacies due to pharmacy/plan contracts, or as the patient moves through the different stages of their insurance plan.   PA #/Case ID/Reference #: [id number]

## 2024-09-14 NOTE — Telephone Encounter (Signed)
 MyChart message sent to pt

## 2024-09-29 ENCOUNTER — Other Ambulatory Visit (HOSPITAL_COMMUNITY): Payer: Self-pay

## 2024-10-12 ENCOUNTER — Telehealth: Payer: Self-pay

## 2024-10-12 ENCOUNTER — Encounter: Payer: Self-pay | Admitting: Family Medicine

## 2024-10-12 NOTE — Telephone Encounter (Signed)
 Copied from CRM #[id number]. Topic: Clinical - Prescription Issue >> Oct 12, 2024  9:44 AM Robinson H wrote: Reason for CRM: Patient states he thinks the wrong next dose of the Wegovy  is being filled by pharmacy, states pharmacy is telling him that the semaglutide -weight management (WEGOVY ) 0.5 MG/0.5ML SOAJ SQ injection is ready for pick up and patient states he should be getting the 1mg . Next dose is due on next Thursday.  [facility] [phone]

## 2024-10-13 MED ORDER — WEGOVY 1 MG/0.5ML ~~LOC~~ SOAJ: 1.0000 mg | SUBCUTANEOUS | 1 refills | Status: AC

## 2024-11-14 ENCOUNTER — Ambulatory Visit: Admitting: Family Medicine

## 2025-07-16 ENCOUNTER — Other Ambulatory Visit

## 2025-07-23 ENCOUNTER — Encounter: Admitting: Family Medicine
